# Patient Record
Sex: Female | Born: 1965 | Race: Black or African American | Hispanic: No | Marital: Single
Health system: Midwestern US, Community
[De-identification: ages and names within clinical notes are randomized; demographics above are authoritative.]

## PROBLEM LIST (undated history)

## (undated) DIAGNOSIS — Z1231 Encounter for screening mammogram for malignant neoplasm of breast: Principal | ICD-10-CM

## (undated) DIAGNOSIS — Z1239 Encounter for other screening for malignant neoplasm of breast: Secondary | ICD-10-CM

## (undated) DIAGNOSIS — R922 Inconclusive mammogram: Secondary | ICD-10-CM

## (undated) DIAGNOSIS — Z01419 Encounter for gynecological examination (general) (routine) without abnormal findings: Secondary | ICD-10-CM

## (undated) DIAGNOSIS — R928 Other abnormal and inconclusive findings on diagnostic imaging of breast: Secondary | ICD-10-CM

## (undated) DIAGNOSIS — Z13820 Encounter for screening for osteoporosis: Secondary | ICD-10-CM

## (undated) DIAGNOSIS — Z1211 Encounter for screening for malignant neoplasm of colon: Secondary | ICD-10-CM

## (undated) DIAGNOSIS — F411 Generalized anxiety disorder: Secondary | ICD-10-CM

## (undated) DIAGNOSIS — R519 Headache, unspecified: Secondary | ICD-10-CM

## (undated) DIAGNOSIS — G478 Other sleep disorders: Secondary | ICD-10-CM

## (undated) DIAGNOSIS — J309 Allergic rhinitis, unspecified: Secondary | ICD-10-CM

## (undated) DIAGNOSIS — M1990 Unspecified osteoarthritis, unspecified site: Secondary | ICD-10-CM

## (undated) DIAGNOSIS — M2550 Pain in unspecified joint: Secondary | ICD-10-CM

## (undated) HISTORY — DX: Unspecified osteoarthritis, unspecified site: M19.90

## (undated) HISTORY — DX: Pain in unspecified joint: M25.50

## (undated) HISTORY — DX: Other sleep disorders: G47.8

## (undated) HISTORY — DX: Allergic rhinitis, unspecified: J30.9

## (undated) HISTORY — PX: PR TOTAL ABDOMINAL HYSTERECT W/WO RMVL TUBE OVARY: 58150

## (undated) HISTORY — PX: PR UNLISTED PROCEDURE HANDS/FINGERS: 26989

## (undated) HISTORY — DX: Generalized anxiety disorder: F41.1

## (undated) HISTORY — PX: PR ANES; KNEE ARTHROSCOPY: AN01382

## (undated) HISTORY — DX: Headache, unspecified: R51.9

## (undated) NOTE — Progress Notes (Signed)
Transcription accepted by Kamrin Sibley STEBBINS on 02/22/2014 at  9:45 AM  ------  duplicate

## (undated) NOTE — Progress Notes (Signed)
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 Full extension and 120 degrees of flexion were achieved. She has been in intensive physical therapy. She has made progress.    PHYSICAL EXAMINATION: I can get her to within 5 of extension with over pressure and flexion at 90 to 95 today. In therapy, she has been able to get a little bit more both ways.    RECOMMENDATION: We are going to persist and try a home extension knee arthrosis and re-evaluate in 2 weeks. If there is plateauing of the flexion gain, I think that she should have a second manipulation with no anticipation of admission that time, but just to help her to continue to improve in flexion. We will discuss this with her insurance company.     [name], MD  RSC/js

## (undated) NOTE — Progress Notes (Signed)
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 She has made dramatic progress since manipulation and aggressive physical therapy. She continues to improve. Her mobilization is finally in a positive direction.    PHYSICAL EXAMINATION: Range of motion today was 10 degrees to 105 degrees, and she ambulates much better, but she still has a Trendelenburg limp and I am sure she is very deconditioned from being so disabled for over a year.    X-RAY: Her knee was x-ray today with excellent position of all components.    RECOMMENDATION: She is to continue in physical therapy 2 to 3 times per week for the next 4 weeks and then follow up again.     [name], MD  RSC/js

## (undated) NOTE — Progress Notes (Signed)
Transcription accepted by Elenora Gamma STEBBINS on 03/15/2014 at 11:13 AM  ------    Judith [name], Judith [name] LEA  [id number]    March 08, 2014    HISTORY: A 72 year old white female 4 weeks postop right total knee replacement.    PHYSICAL EXAMINATION: She has 1 sub-q suture coming through her proximal incision that was cut off and the area cleaned with peroxide. That has healed over now. She has the usual amount of swelling expected and range of motion of 5 to 90.     RECOMMENDATION: She had been getting home physical therapy. I think she just started outpatient physical therapy now. She is still on oxycodone at 5 mg to 10 mg at a time, gradually withdrawing and weaning down. She needs to continue this. She is given a renewal to try to get down to 1/2 to 1 tablet. She should follow up in 4 weeks.     [name], MD  RSC/js

## (undated) NOTE — Progress Notes (Signed)
Transcription accepted by Elenora Gamma STEBBINS on 02/22/2014 at  9:45 AM  ------    Judith [name], Judith [name]  [id number]    December 15, 2013    HISTORY: She returns. She had a corticosteroid injection a month ago had no benefit and continues with constant lateral right knee pain, painful limp and inability to function and inability to walk any significant distance or do any prolonged standing. I think she is a candidate for right total knee replacement and will schedule her in the near future.     PHYSICAL EXAMINATION:   Vital Signs: Blood pressure 121/73, pulse 72 and regular, O2 sat 99%.  General Appearance: A middle-aged white female to oriented place, time, person in no acute distress.  HEENT: Pupils equal, round and reactive to light. EOM intact. Throat clear.  Neck: Supple with no masses. Equal carotid pulses bilaterally.  Chest: Equal breath sounds bilaterally.  Heart: Normal sinus rhythm. No murmurs heard.  Abdomen: Nontender. No masses or organomegaly.   Extremity: Exam is unchanged from the detailed description of the visit on November 17, 2013.     IMPRESSION: Osteoarthritis, right knee.    PLAN: The plan will be to proceed with right total knee replacement. We have discussed this procedure in detail including the need for anesthesia and efforts for pain management with extrapleural injection in the knee, possible nerve block. The patient understands there is a risk of infection, deep vein thrombophlebitis which could lead to fatal pulmonary embolus, the potential for inadvertent injury to arteries or nerves and bone or tendon ligament, surgery could require repeat surgeries, compromise in outcome and left knee rehabilitation will be 3 to 6 months in duration. There is potential for stiffness, possible manipulation in the future. She wishes to proceed.     [name], MD  RSC/js

---
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
## 1999-12-09 HISTORY — PX: PR TONSILLECTOMY ONE-HALF <AGE 12: 42825

## 2002-12-08 HISTORY — PX: LAPAROSCOP GASTRIC BYPASS: S2085

## 2013-11-17 ENCOUNTER — Encounter (INDEPENDENT_AMBULATORY_CARE_PROVIDER_SITE_OTHER): Payer: Self-pay | Admitting: Orthopaedic Surgery

## 2013-11-17 ENCOUNTER — Ambulatory Visit (INDEPENDENT_AMBULATORY_CARE_PROVIDER_SITE_OTHER): Admitting: Orthopaedic Surgery

## 2013-11-17 VITALS — BP 133/90 | HR 75 | Ht 62.0 in | Wt 145.0 lb

## 2013-11-18 NOTE — Progress Notes (Signed)
This note was dictated by Clawson, Robert Stebbins.

## 2013-11-20 LAB — R/O STAPH AUREUS C/S

## 2013-11-25 NOTE — Progress Notes (Signed)
This note was dictated by Olden Klauer Stebbins.

## 2013-12-08 HISTORY — PX: JOINT REPLACEMENT: SHX5216

## 2013-12-15 ENCOUNTER — Ambulatory Visit (INDEPENDENT_AMBULATORY_CARE_PROVIDER_SITE_OTHER): Admitting: Orthopaedic Surgery

## 2013-12-15 ENCOUNTER — Other Ambulatory Visit: Payer: Self-pay | Admitting: Orthopaedic Surgery

## 2013-12-15 VITALS — BP 121/73 | HR 72

## 2013-12-15 DIAGNOSIS — M1711 Unilateral primary osteoarthritis, right knee: Secondary | ICD-10-CM

## 2013-12-15 DIAGNOSIS — M1710 Unilateral primary osteoarthritis, unspecified knee: Secondary | ICD-10-CM

## 2013-12-15 DIAGNOSIS — M25561 Pain in right knee: Secondary | ICD-10-CM

## 2013-12-15 DIAGNOSIS — Z01818 Encounter for other preprocedural examination: Secondary | ICD-10-CM

## 2013-12-15 DIAGNOSIS — M25569 Pain in unspecified knee: Secondary | ICD-10-CM

## 2013-12-15 LAB — COMPREHENSIVE METABOLIC PANEL
ALT (GPT): 18 U/L (ref 6–40)
AST (GOT): 25 U/L (ref 15–40)
Albumin: 4.1 g/dL (ref 3.5–5.2)
Alkaline Phosphatase (Total): 80 U/L (ref 34–121)
Anion Gap: 7 (ref 3–11)
Bilirubin (Total): 0.5 mg/dL (ref 0.2–1.3)
Calcium: 9.5 mg/dL (ref 8.9–10.2)
Carbon Dioxide, Total: 29 mEq/L (ref 22–32)
Chloride: 105 mEq/L (ref 98–108)
Creatinine: 0.66 mg/dL (ref 0.38–1.02)
GFR, Calc, African American: >60 mL/min (ref >59)
GFR, Calc, European American: >60 mL/min (ref >59)
Glucose: 107 mg/dL (ref 62–125)
Potassium: 5.2 mEq/L (ref 3.7–5.2)
Protein (Total): 6.6 g/dL (ref 6.0–8.2)
Sodium: 141 mEq/L (ref 136–145)
Urea Nitrogen: 13 mg/dL (ref 8–21)

## 2013-12-15 LAB — CBC, DIFF
% Basophils: 0 %
% Eosinophils: 1 %
% Immature Granulocytes: 0 %
% Lymphocytes: 47 %
% Monocytes: 8 %
% Neutrophils: 44 %
Absolute Eosinophil Count: 0.06 10*3/uL (ref 0.00–0.50)
Absolute Lymphocyte Count: 2.71 10*3/uL (ref 1.00–4.80)
Basophils: 0.01 10*3/uL (ref 0.00–0.20)
Hematocrit: 41 % (ref 36–45)
Hemoglobin: 13.5 g/dL (ref 11.5–15.5)
Immature Granulocytes: 0 10*3/uL (ref 0.00–0.05)
MCH: 28.9 pg (ref 27.3–33.6)
MCHC: 32.8 g/dL (ref 32.2–36.5)
MCV: 88 fL (ref 81–98)
Monocytes: 0.48 10*3/uL (ref 0.00–0.80)
Neutrophils: 2.56 10*3/uL (ref 1.80–7.00)
Platelet Count: 359 10*3/uL (ref 150–400)
RBC: 4.67 mil/uL (ref 3.80–5.00)
RDW-CV: 14.2 % (ref 11.6–14.4)
WBC: 5.82 10*3/uL (ref 4.3–10.0)

## 2013-12-15 NOTE — Progress Notes (Signed)
Preoperative instruction given per [facility] protocol. Literature for DPOA, [employer] Directive, Pt rights and Responsibilities, and [facility] About Your Surgery booklet given. Stressed NPO status after midnight, ruling out sips of water, ice, etc prior to surgery. Explained what medications to avoid and use of chorhexidine soap prior to surgery. Stressed the importance of having someone with the patient 24hrs a day for 7 days post operatively. Signs and symptoms of post-op complications including infection, DVTs, and constipation described. The importance of pain control and participation in physical therapy, refill policy on narcotics and antibiotic prophylactics for life also explained. No further questions or concerns at this time, patient advised to call if needed.    Family, mom - specifically, to help patient at home post-operatively. [facility]. [facility] to [facility].    Insurance  Advertising copywriterUNITED HEALTHCARE [employer]ANDARD    My-Linh Barnie AldermanHuynh, BSN, RN  [facility]  [phone]

## 2013-12-21 NOTE — Addendum Note (Signed)
Addended by: Cooper RenderLAKE, NICHOLAS on: 12/21/2013 02:00 PM     Modules accepted: Orders

## 2013-12-21 NOTE — Addendum Note (Signed)
Addended by: LAKE, NICHOLAS on: 12/21/2013 02:00 PM     Modules accepted: Orders

## 2013-12-24 NOTE — Progress Notes (Signed)
This note was dictated by Mikel Pyon Stebbins.

## 2013-12-27 ENCOUNTER — Other Ambulatory Visit: Payer: Self-pay | Admitting: Orthopaedic Surgery

## 2013-12-27 ENCOUNTER — Ambulatory Visit (INDEPENDENT_AMBULATORY_CARE_PROVIDER_SITE_OTHER): Admitting: Orthopaedic Surgery

## 2013-12-27 ENCOUNTER — Encounter (INDEPENDENT_AMBULATORY_CARE_PROVIDER_SITE_OTHER): Payer: Self-pay | Admitting: Orthopaedic Surgery

## 2013-12-27 VITALS — BP 135/73 | HR 96

## 2013-12-27 DIAGNOSIS — Z01818 Encounter for other preprocedural examination: Secondary | ICD-10-CM

## 2013-12-27 DIAGNOSIS — M1711 Unilateral primary osteoarthritis, right knee: Secondary | ICD-10-CM

## 2013-12-27 DIAGNOSIS — M1710 Unilateral primary osteoarthritis, unspecified knee: Secondary | ICD-10-CM

## 2013-12-27 DIAGNOSIS — S025XXA Fracture of tooth (traumatic), initial encounter for closed fracture: Secondary | ICD-10-CM

## 2013-12-27 LAB — URINALYSIS COMPLETE, URN
Bacteria, URN: NONE SEEN
Bilirubin (Qual), URN: NEGATIVE
Epith Cells_Renal/Trans,URN: NEGATIVE /HPF
Glucose Qual, URN: NEGATIVE mg/dL
Ketones, URN: NEGATIVE mg/dL
Leukocyte Esterase, URN: NEGATIVE
Nitrite, URN: NEGATIVE
Occult Blood, URN: NEGATIVE
Protein (Alb Semiquant), URN: NEGATIVE mg/dL
RBC, URN: NEGATIVE /HPF
Specific Gravity, URN: 1.013 g/mL (ref 1.002–1.027)
WBC, URN: NEGATIVE /HPF

## 2013-12-29 LAB — URINE C/S: Colony Count: 1500

## 2013-12-30 DIAGNOSIS — S025XXA Fracture of tooth (traumatic), initial encounter for closed fracture: Secondary | ICD-10-CM | POA: Insufficient documentation

## 2013-12-30 NOTE — Progress Notes (Signed)
HISTORY: This 48 year old white female, who lives in [place], is referred for evaluation of persistent right knee pain. She has a 3 year history of right knee pain, but had arthroscopic partial medial meniscectomy 20 years prior to that which had been quite beneficial at the time. Three years ago, she slipped on some ice and twisted her knee badly with acute pain and swelling. She failed to improve and came in to orthopedic care and within 6 months had arthroscopic surgery with trimming of a new meniscus tear on the medial side. She had a little bit of benefit for a while, but continued with dull constant aching medial knee pain, and occasionally sharper pain, even without activity and a grinding sensation in her knee along the medial joint line. She had another scope about a year ago for a lateral meniscus tear. Removal of it was of no benefit. She has continued to struggle with ongoing pain, difficulty with stairs and squatting; particularly going down hill on stairs. Her walking distance is down to a block. She used to love riding a motorcycle, but can not tolerate the vibration now. She has noted that a heating pad is of some benefit to her. 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 She lives with her mother and has a hobby of making children's dolls.   Lidoderm patches have been beneficial over the medial aspect of her knee for pain and she uses acetaminophen. She is allergic to aspirin and ibuprofen. She has had insomnia. She lost her father 6 months ago and is still grieving and is on Xanax at bedtime for that problem.  This patient had a gastric bypass graft in 2004; she went to 252 to 140. She apparently had a greater weight loss for a while and was concerned that she was getting malnourished, but that has improved over time and she started to stabilize and her current weight, she says, is around 140. She has had become significantly impaired with the ongoing pain. 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  PHYSICAL EXAMINATION: General appearance is a middle-aged, white female oriented to place, time and person and in no acute distress. On standing she has physiologic valgus. She may have just a slight increase in valgus in the right knee. She walks with a little bit of a stiff-knee gait on the right side. She has limited ability to do a deep knee bend, about 30 degrees. Her range of motion of right knee is 0 to 95, bit is 0 to 130 on the left. At the right knee she has a mild effusion, 2+ medial and lateral joint line tenderness and mild patellofemoral crepitus. Ligamentous evaluation, varus and valgus stress and anterior and posterior drawer sign are negative. The McMurray's maneuver was painful. Supine she appeared to have equal leg lengths and full range of motion of both hips.    PHYSICAL EXAMINATION:   Vital Signs: Blood pressure 121/73, pulse 72 and regular, O2 sat 99%.  General Appearance: A middle-aged white female to oriented place, time, person in no acute distress.  HEENT: Pupils equal, round and reactive to light. EOM intact. Throat clear.  Neck: Supple with no masses. Equal carotid pulses bilaterally.  Chest: Equal breath sounds bilaterally.  Heart: Normal sinus rhythm. No murmurs heard.  Abdomen: Nontender. No masses or organomegaly.   Extremity:  Exam is unchanged from the detailed description of the visit on November 17, 2013.      X-RAYS: Four view standing bilateral knee series shows marked lateral joint space narrowing on the standing, PA flexed knee view. The left knee has a small medial joint line osteophytes.  She also had an MRI in November which shows severe cartilage loss on the lateral patellar facet and severe cartilage thinning laterally with narrowing of the joint space significantly and  moderate cartilage changes medially. 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    This note was dictated by Chauncey Fischerlawson, Phelan Schadt Stebbins.

## 2014-01-20 ENCOUNTER — Encounter (INDEPENDENT_AMBULATORY_CARE_PROVIDER_SITE_OTHER): Admitting: Physician Assistant

## 2014-01-25 ENCOUNTER — Other Ambulatory Visit: Payer: Self-pay | Admitting: Orthopaedic Surgery

## 2014-01-25 LAB — COMPREHENSIVE METABOLIC PANEL
ALT (GPT): 12 U/L (ref 6–40)
AST (GOT): 21 U/L (ref 15–40)
Albumin: 3.8 g/dL (ref 3.5–5.2)
Alkaline Phosphatase (Total): 75 U/L (ref 34–121)
Anion Gap: 6 (ref 3–11)
Bilirubin (Total): 0.5 mg/dL (ref 0.2–1.3)
Calcium: 9.5 mg/dL (ref 8.9–10.2)
Carbon Dioxide, Total: 30 mEq/L (ref 22–32)
Chloride: 104 mEq/L (ref 98–108)
Creatinine: 0.63 mg/dL (ref 0.38–1.02)
GFR, Calc, African American: >60 mL/min (ref >59)
GFR, Calc, European American: >60 mL/min (ref >59)
Glucose: 97 mg/dL (ref 62–125)
Potassium: 4.2 mEq/L (ref 3.7–5.2)
Protein (Total): 6.3 g/dL (ref 6.0–8.2)
Sodium: 140 mEq/L (ref 136–145)
Urea Nitrogen: 10 mg/dL (ref 8–21)

## 2014-01-25 LAB — CBC, DIFF
% Basophils: 0 %
% Eosinophils: 1 %
% Immature Granulocytes: 0 %
% Lymphocytes: 40 %
% Monocytes: 7 %
% Neutrophils: 52 %
Absolute Eosinophil Count: 0.07 10*3/uL (ref 0.00–0.50)
Absolute Lymphocyte Count: 2.14 10*3/uL (ref 1.00–4.80)
Basophils: 0.02 10*3/uL (ref 0.00–0.20)
Hematocrit: 40 % (ref 36–45)
Hemoglobin: 13.1 g/dL (ref 11.5–15.5)
Immature Granulocytes: 0.01 10*3/uL (ref 0.00–0.05)
MCH: 28.5 pg (ref 27.3–33.6)
MCHC: 32.4 g/dL (ref 32.2–36.5)
MCV: 88 fL (ref 81–98)
Monocytes: 0.36 10*3/uL (ref 0.00–0.80)
Neutrophils: 2.78 10*3/uL (ref 1.80–7.00)
Platelet Count: 288 10*3/uL (ref 150–400)
RBC: 4.6 mil/uL (ref 3.80–5.00)
RDW-CV: 13.9 % (ref 11.6–14.4)
WBC: 5.38 10*3/uL (ref 4.3–10.0)

## 2014-01-25 LAB — URINALYSIS COMPLETE, URN
Bacteria, URN: NONE SEEN
Bilirubin (Qual), URN: NEGATIVE
Epith Cells_Renal/Trans,URN: NEGATIVE /HPF
Glucose Qual, URN: NEGATIVE mg/dL
Ketones, URN: NEGATIVE mg/dL
Leukocyte Esterase, URN: NEGATIVE
Nitrite, URN: NEGATIVE
Occult Blood, URN: NEGATIVE
Protein (Alb Semiquant), URN: NEGATIVE mg/dL
RBC, URN: NEGATIVE /HPF
Specific Gravity, URN: 1.003 g/mL (ref 1.002–1.027)
WBC, URN: NEGATIVE /HPF
pH, URN: 7 (ref 5.0–8.0)

## 2014-01-28 LAB — URINE C/S: Colony Count: 20000

## 2014-01-28 LAB — R/O STAPH AUREUS C/S

## 2014-02-06 DIAGNOSIS — IMO0002 Reserved for concepts with insufficient information to code with codable children: Secondary | ICD-10-CM

## 2014-02-06 HISTORY — PX: PR ANES; TOTAL KNEE REPLACEMENT: AN27447

## 2014-02-10 ENCOUNTER — Telehealth (INDEPENDENT_AMBULATORY_CARE_PROVIDER_SITE_OTHER): Payer: Self-pay | Admitting: Orthopaedic Surgery

## 2014-02-10 NOTE — Telephone Encounter (Signed)
Returned call to DeForestJodi to give verbal [facility] 3x2, 2x1.

## 2014-02-10 NOTE — Telephone Encounter (Signed)
Need verbal orders 1 week 1,3x a week for 2 weeks and, 2x for 1.

## 2014-02-15 ENCOUNTER — Encounter (INDEPENDENT_AMBULATORY_CARE_PROVIDER_SITE_OTHER): Admitting: Orthopaedic Surgery

## 2014-02-22 ENCOUNTER — Encounter (INDEPENDENT_AMBULATORY_CARE_PROVIDER_SITE_OTHER): Admitting: Orthopaedic Surgery

## 2014-02-22 ENCOUNTER — Encounter (INDEPENDENT_AMBULATORY_CARE_PROVIDER_SITE_OTHER): Payer: Self-pay | Admitting: Orthopaedic Surgery

## 2014-02-22 ENCOUNTER — Ambulatory Visit (INDEPENDENT_AMBULATORY_CARE_PROVIDER_SITE_OTHER): Admitting: Orthopaedic Surgery

## 2014-02-22 VITALS — BP 130/62 | HR 101

## 2014-02-22 DIAGNOSIS — Z471 Aftercare following joint replacement surgery: Secondary | ICD-10-CM | POA: Insufficient documentation

## 2014-02-22 MED ORDER — OXYCODONE HCL 5 MG OR TABS
5.0000 mg | ORAL_TABLET | ORAL | Status: DC | PRN
Start: 2014-02-22 — End: 2014-03-08

## 2014-02-22 NOTE — Progress Notes (Signed)
This note was dictated by Cruise Baumgardner Stebbins.

## 2014-03-08 ENCOUNTER — Ambulatory Visit (INDEPENDENT_AMBULATORY_CARE_PROVIDER_SITE_OTHER): Admitting: Orthopaedic Surgery

## 2014-03-08 ENCOUNTER — Encounter (INDEPENDENT_AMBULATORY_CARE_PROVIDER_SITE_OTHER): Payer: Self-pay | Admitting: Orthopaedic Surgery

## 2014-03-08 VITALS — BP 126/80 | HR 67

## 2014-03-08 DIAGNOSIS — Z471 Aftercare following joint replacement surgery: Secondary | ICD-10-CM

## 2014-03-08 MED ORDER — OXYCODONE HCL 5 MG OR TABS
ORAL_TABLET | ORAL | Status: AC
Start: 2014-03-08 — End: ?

## 2014-03-08 NOTE — Progress Notes (Signed)
This note was dictated by Adhira Jamil Stebbins.

## 2014-03-22 ENCOUNTER — Encounter: Payer: Self-pay | Admitting: Orthopaedic Surgery

## 2014-03-22 ENCOUNTER — Ambulatory Visit (INDEPENDENT_AMBULATORY_CARE_PROVIDER_SITE_OTHER): Admitting: Orthopaedic Surgery

## 2014-03-22 VITALS — BP 108/73 | HR 98

## 2014-03-22 DIAGNOSIS — Z471 Aftercare following joint replacement surgery: Secondary | ICD-10-CM

## 2014-03-22 NOTE — Progress Notes (Signed)
This note was dictated by Nimco Bivens Stebbins.

## 2014-03-23 NOTE — Addendum Note (Signed)
Addended by: Alva GarnetWALSH, SHEILA on: 03/23/2014 09:54 AM     Modules accepted: Orders

## 2014-03-27 DIAGNOSIS — Z96659 Presence of unspecified artificial knee joint: Secondary | ICD-10-CM

## 2014-03-27 DIAGNOSIS — M2469 Ankylosis, other specified joint: Secondary | ICD-10-CM

## 2014-03-27 DIAGNOSIS — M24669 Ankylosis, unspecified knee: Secondary | ICD-10-CM

## 2014-03-27 HISTORY — PX: SURGICAL HX OTHER: 99

## 2014-03-29 ENCOUNTER — Telehealth (INDEPENDENT_AMBULATORY_CARE_PROVIDER_SITE_OTHER): Payer: Self-pay | Admitting: Orthopaedic Surgery

## 2014-03-29 NOTE — Telephone Encounter (Signed)
Pt has her first PT appt today after her manipulation. They want to talk to someone about a CPM machine for this patient. Ask for Jacolyn ReedyJohn Miller as he is the therapist working with her.

## 2014-03-30 NOTE — Telephone Encounter (Signed)
Judith ReedyJohn [name] called and was very frustrated that nobody called him back yesterday, he is dealing with a difficult patient.  He would like a call back as soon as possible as he called 2 times yesterday and he would like an answer.

## 2014-03-30 NOTE — Telephone Encounter (Signed)
Verbal ok given for CPM machine. Patient was instructed to come to office 03/31/14.

## 2014-03-31 ENCOUNTER — Encounter (INDEPENDENT_AMBULATORY_CARE_PROVIDER_SITE_OTHER): Admitting: Orthopaedic Surgery

## 2014-03-31 ENCOUNTER — Ambulatory Visit (INDEPENDENT_AMBULATORY_CARE_PROVIDER_SITE_OTHER): Admitting: Orthopaedic Surgery

## 2014-03-31 VITALS — BP 120/72 | HR 98 | Ht 62.0 in | Wt 135.0 lb

## 2014-03-31 DIAGNOSIS — Z471 Aftercare following joint replacement surgery: Secondary | ICD-10-CM

## 2014-03-31 DIAGNOSIS — M24669 Ankylosis, unspecified knee: Secondary | ICD-10-CM

## 2014-03-31 DIAGNOSIS — M2469 Ankylosis, other specified joint: Secondary | ICD-10-CM | POA: Insufficient documentation

## 2014-03-31 NOTE — Progress Notes (Signed)
This note was dictated by Charlina Dwight Stebbins.

## 2014-04-05 ENCOUNTER — Encounter (INDEPENDENT_AMBULATORY_CARE_PROVIDER_SITE_OTHER): Admitting: Orthopaedic Surgery

## 2014-04-07 ENCOUNTER — Encounter (INDEPENDENT_AMBULATORY_CARE_PROVIDER_SITE_OTHER): Admitting: Orthopaedic Surgery

## 2014-04-13 ENCOUNTER — Ambulatory Visit (INDEPENDENT_AMBULATORY_CARE_PROVIDER_SITE_OTHER): Admitting: Orthopaedic Surgery

## 2014-04-13 ENCOUNTER — Encounter (INDEPENDENT_AMBULATORY_CARE_PROVIDER_SITE_OTHER): Payer: Self-pay | Admitting: Orthopaedic Surgery

## 2014-04-13 VITALS — BP 118/75 | HR 81

## 2014-04-13 DIAGNOSIS — Z471 Aftercare following joint replacement surgery: Secondary | ICD-10-CM

## 2014-04-13 DIAGNOSIS — M2469 Ankylosis, other specified joint: Secondary | ICD-10-CM

## 2014-04-13 DIAGNOSIS — M24669 Ankylosis, unspecified knee: Secondary | ICD-10-CM

## 2014-04-13 NOTE — Progress Notes (Signed)
This note was dictated by Genecis Veley Stebbins.

## 2014-04-20 ENCOUNTER — Telehealth (INDEPENDENT_AMBULATORY_CARE_PROVIDER_SITE_OTHER): Payer: Self-pay | Admitting: Orthopaedic Surgery

## 2014-04-20 NOTE — Telephone Encounter (Signed)
Pt calling for clarification. She thought the manipulation was going to be at a later date but received the phone call from anesthesia about it being scheduled tomorrow. Please call her ASAP. [phone]

## 2014-04-20 NOTE — Telephone Encounter (Signed)
Surgery has been cancelled. Patient notified. She states that she is making progress in therapy.

## 2014-04-20 NOTE — Telephone Encounter (Signed)
John, physical therapist, called to get clarification on Ms. Krajewski's manipulation. The last time he talked to Dr. Michele Mcalpinelawson they were going to see how Tad MooreCasandra does as far as improvement and then see her back on May 21st. The patient got a call yesterday that her manipulation is now scheduled for tomorrow. Please call John to discuss! They feel like she is making improvement.

## 2014-04-27 ENCOUNTER — Encounter (INDEPENDENT_AMBULATORY_CARE_PROVIDER_SITE_OTHER): Admitting: Orthopaedic Surgery

## 2014-05-05 ENCOUNTER — Ambulatory Visit (INDEPENDENT_AMBULATORY_CARE_PROVIDER_SITE_OTHER): Admitting: Orthopaedic Surgery

## 2014-05-05 ENCOUNTER — Encounter (INDEPENDENT_AMBULATORY_CARE_PROVIDER_SITE_OTHER): Admitting: Orthopaedic Surgery

## 2014-05-05 VITALS — BP 130/90 | HR 83 | Ht 62.0 in | Wt 135.0 lb

## 2014-05-05 DIAGNOSIS — M24669 Ankylosis, unspecified knee: Secondary | ICD-10-CM

## 2014-05-05 DIAGNOSIS — Z471 Aftercare following joint replacement surgery: Secondary | ICD-10-CM

## 2014-05-05 DIAGNOSIS — Z96659 Presence of unspecified artificial knee joint: Secondary | ICD-10-CM

## 2014-05-05 DIAGNOSIS — M2469 Ankylosis, other specified joint: Secondary | ICD-10-CM

## 2014-05-05 NOTE — Progress Notes (Signed)
This note was dictated by Tamaria Dunleavy Stebbins.

## 2014-06-01 ENCOUNTER — Ambulatory Visit (INDEPENDENT_AMBULATORY_CARE_PROVIDER_SITE_OTHER): Admitting: Orthopaedic Surgery

## 2014-06-01 VITALS — BP 107/80 | HR 82

## 2014-06-01 DIAGNOSIS — M24669 Ankylosis, unspecified knee: Secondary | ICD-10-CM

## 2014-06-01 DIAGNOSIS — M2469 Ankylosis, other specified joint: Secondary | ICD-10-CM

## 2014-06-01 DIAGNOSIS — Z471 Aftercare following joint replacement surgery: Secondary | ICD-10-CM

## 2014-06-01 NOTE — Progress Notes (Signed)
This note was dictated by Timarion Agcaoili Stebbins.

## 2014-07-06 ENCOUNTER — Ambulatory Visit (INDEPENDENT_AMBULATORY_CARE_PROVIDER_SITE_OTHER): Admitting: Orthopaedic Surgery

## 2014-07-06 ENCOUNTER — Encounter (INDEPENDENT_AMBULATORY_CARE_PROVIDER_SITE_OTHER): Payer: Self-pay | Admitting: Orthopaedic Surgery

## 2014-07-06 VITALS — BP 93/41 | HR 80

## 2014-07-06 DIAGNOSIS — Z96651 Presence of right artificial knee joint: Secondary | ICD-10-CM

## 2014-07-06 DIAGNOSIS — Z471 Aftercare following joint replacement surgery: Secondary | ICD-10-CM

## 2014-07-06 DIAGNOSIS — Z96659 Presence of unspecified artificial knee joint: Secondary | ICD-10-CM

## 2014-07-06 NOTE — Progress Notes (Signed)
This note was dictated by Sole Lengacher Stebbins.

## 2014-10-12 ENCOUNTER — Ambulatory Visit (HOSPITAL_BASED_OUTPATIENT_CLINIC_OR_DEPARTMENT_OTHER): Payer: Self-pay | Admitting: Counselor

## 2014-10-19 ENCOUNTER — Encounter (HOSPITAL_BASED_OUTPATIENT_CLINIC_OR_DEPARTMENT_OTHER): Payer: Self-pay | Admitting: Counselor

## 2014-10-26 ENCOUNTER — Encounter (HOSPITAL_BASED_OUTPATIENT_CLINIC_OR_DEPARTMENT_OTHER): Payer: Self-pay | Admitting: Counselor

## 2014-11-09 ENCOUNTER — Encounter (HOSPITAL_BASED_OUTPATIENT_CLINIC_OR_DEPARTMENT_OTHER): Payer: Self-pay | Admitting: Counselor

## 2015-12-04 ENCOUNTER — Encounter

## 2015-12-04 ENCOUNTER — Inpatient Hospital Stay: Admit: 2015-12-04 | Payer: PRIVATE HEALTH INSURANCE

## 2015-12-04 DIAGNOSIS — Z1231 Encounter for screening mammogram for malignant neoplasm of breast: Secondary | ICD-10-CM

## 2016-01-14 ENCOUNTER — Telehealth (INDEPENDENT_AMBULATORY_CARE_PROVIDER_SITE_OTHER): Payer: Self-pay | Admitting: Orthopaedic Surgery

## 2016-01-14 NOTE — Telephone Encounter (Signed)
(  TEXTING IS AN OPTION FOR [facility] ONLY)  Is this a [facility] clinic? No      RETURN CALL: Detailed message on voicemail only      SUBJECT:  Appointment Request     REASON FOR REQUEST/SYMPTOMS: ER follow-up possible re-injury to right knee   REFERRING PROVIDER: ER  REQUEST APPOINTMENT WITH: Dr. Michele Mcalpine  REQUESTED DATE: sometime next week, TIME: mornings  UNABLE TO APPOINT BECAUSE: per sop

## 2016-01-14 NOTE — Telephone Encounter (Signed)
RETURN CALL: General message OK      SUBJECT:  General Message     REASON FOR REQUEST: Patient calling regarding urgent symptom (patient of Dr.Clawson)    MESSAGE: Patient has knee pain from a recent fall, had complete knee replacement with Dr.Clawson. Please advise.

## 2016-01-14 NOTE — Telephone Encounter (Signed)
Please refer to both closed TEs on 01/14/2016. Patient returning phone call, patient is okay with waiting til CLAWSON, ROBERT STEBBINS is back in the office. Please call the patient and discuss, thank you.     RETURN CALL: Detailedmessage OK      SUBJECT: Appointment Request     REASON FOR REQUEST/SYMPTOMS: Patient seen at [facility] hospital emergency department after fall, knee pain  REFERRING PROVIDER: established  REQUEST APPOINTMENT WITH: Dr.Clawson  REQUESTED DATE: as soon as possible, TIME: anytime  UNABLE TO APPOINT BECAUSE: [facility] unable to appoint per SOP

## 2016-01-14 NOTE — Telephone Encounter (Signed)
LVM for patient to schedule ER f/u appointment.

## 2016-01-14 NOTE — Telephone Encounter (Signed)
Called patient, states she already received a call to schedule follow up from ER visit. Patient scheduled to see Dr. Michele Mcalpine on Thursday.

## 2016-01-14 NOTE — Telephone Encounter (Signed)
Left message letting patient know to call and set up an appointment with Dr. Michele Mcalpine, he is currently out of the office but she can also set up an appointment with her PCP if she needs to get in sooner.

## 2016-01-14 NOTE — Telephone Encounter (Signed)
RETURN CALL: General message OK      SUBJECT:  Appointment Request     REASON FOR REQUEST/SYMPTOMS: Patient seen at [facility] hospital emergency department after fall, knee pain  REFERRING PROVIDER: established  REQUEST APPOINTMENT WITH: Dr.Clawson  REQUESTED DATE: as soon as possible, TIME:   UNABLE TO APPOINT BECAUSE: [facility] cannot appointment ED follow up

## 2016-01-14 NOTE — Telephone Encounter (Signed)
Patient is s/p R TKA on 02/06/14, MUA 03/27/14. Attempted to return call to patient, but no answer. Left a voicemail message to call the clinic back at earliest convenience.

## 2016-01-16 ENCOUNTER — Telehealth (INDEPENDENT_AMBULATORY_CARE_PROVIDER_SITE_OTHER): Payer: Self-pay | Admitting: Orthopaedic Surgery

## 2016-01-16 NOTE — Telephone Encounter (Signed)
Rerouting.    Please advise.

## 2016-01-16 NOTE — Telephone Encounter (Signed)
(  TEXTING IS AN OPTION FOR [facility] ONLY)  Is this a [facility] clinic? No      RETURN CALL: OK to leave detailed message with anyone that answers      SUBJECT:  General Message     REASON FOR REQUEST: patient returning call from [place].  Please request xray from [facility] in [facility].  Their fax number is [phone]  Thank you    MESSAGE: see above

## 2016-01-17 ENCOUNTER — Ambulatory Visit (INDEPENDENT_AMBULATORY_CARE_PROVIDER_SITE_OTHER): Admitting: Orthopaedic Surgery

## 2016-01-17 VITALS — BP 117/77 | HR 80

## 2016-01-17 DIAGNOSIS — Z96651 Presence of right artificial knee joint: Secondary | ICD-10-CM

## 2016-01-17 DIAGNOSIS — S86811A Strain of other muscle(s) and tendon(s) at lower leg level, right leg, initial encounter: Secondary | ICD-10-CM | POA: Insufficient documentation

## 2016-01-17 NOTE — Telephone Encounter (Signed)
Patient's films are now available on [employer]

## 2016-01-21 NOTE — Progress Notes (Signed)
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  She had a right total knee with a DePuy ATTUNE knee prosthesis.  She struggled with range of motion initially, but, when last seen 3 months' postop, had range of motion of 0-120 and was improving.  She tells me that she went ahead and developed essentially full extension and 120 degrees of flexion.  She has been doing well.  She has been relieved of the preop pain, had gone back to work.    With the recent snow, she walked outside a week ago.  With snow on her foot, she came inside and slipped on a floor and fell, causing a severe flexion force on her right total knee, so that it flexed more than it had ever done.  Since then, she has had marked pain.  She went to an emergency care place, and an x-ray suggested no injury to the prosthesis.  She has been in a knee immobilizer.  She also injured her right ankle on the lateral side, is wearing an air boot for a lateral ankle sprain.  She is improving.  She can walk without the devices with a walker.    PHYSICAL EXAMINATION     Her knee is noted to be swollen.  There is ecchymosis over the anterior medial and lateral capsule of the knee, and she is tender in this area and tender over her patellar tendon.  She tolerates 90 degrees of flexion, past that it becomes painful.  She has extension power.  It is painful enough that it is weakened with resistance.    IMAGING     X-rays were reviewed.  There does not appear to be any disruption of the patella or alteration in patellar height or distance.    IMPRESSION     A patellar tendon strain, status post right total knee arthroplasty.    RECOMMENDATION     I think she should protect this knee to avoid reinjury for about 6 weeks.  She will have a home visit from [facility] once they get a lightweight lockable knee brace in  about a week.  In the interim, she will have to continue to use the knee immobilizer.  It makes it impossible for her to drive, but I think that the degree of pain that she is in now implies at least some injury that needs to recover, and she is at risk for further injury if she were to fall again.  She will follow up with [place] in 6 weeks or sooner for any issues or concerns, and we will be in touch once she gets the new brace.

## 2016-01-24 ENCOUNTER — Telehealth (INDEPENDENT_AMBULATORY_CARE_PROVIDER_SITE_OTHER): Payer: Self-pay | Admitting: Orthopaedic Surgery

## 2016-01-24 NOTE — Telephone Encounter (Signed)
Patient states that they have not received a call back regarding their brace for their knee. She states that at their last appointment, they had concluded that her current one is too small. Please return call to patient to discuss.

## 2016-01-24 NOTE — Telephone Encounter (Signed)
Called and told her that I have spoken to [facility] about this.

## 2016-01-29 NOTE — Telephone Encounter (Signed)
Talked to [place] today and she has the new brace.

## 2016-01-30 ENCOUNTER — Telehealth (INDEPENDENT_AMBULATORY_CARE_PROVIDER_SITE_OTHER): Payer: Self-pay | Admitting: Orthopaedic Surgery

## 2016-01-30 NOTE — Telephone Encounter (Signed)
Re-routed to clinical pool

## 2016-01-30 NOTE — Telephone Encounter (Signed)
(  TEXTING IS AN OPTION FOR [facility] ONLY)  Is this a [facility] clinic? No      RETURN CALL: Detailed message on voicemail only      SUBJECT:  Form/Letter/Paperwork Request     REASON FOR REQUEST: Patient called to state that she is returning a call to [facility] regarding her return to work paperwork. Please call patient back to advise.    Thank you!    FORM/LETTER/PAPERWORK IS FOR: return to work  [employer] SHOULD FILL IT OUT?: [name], Judith Patty  WILL PATIENT PICK UP?: PLEASE COORDINATE WITH PATIENT   NEEDED BY: 01/30/16  ADDITIONAL INFORMATION: na

## 2016-01-31 NOTE — Telephone Encounter (Signed)
I called patient and have re faxed the papers to her and her employer.

## 2016-01-31 NOTE — Telephone Encounter (Signed)
Re-routed to clinical pool

## 2016-01-31 NOTE — Telephone Encounter (Signed)
Patient calling back, she and employer did not receive the faxes. She would like to have it tried 1 more time if possible. She would also like a copy of the paperwork - mailed to home address on record.

## 2016-01-31 NOTE — Telephone Encounter (Signed)
Called [place] and told her we have tried multiple times, with and without the 1 prrefix and none have gone through. I told her we could send them in the mail tomorrow.

## 2016-02-01 NOTE — Telephone Encounter (Signed)
Patient called back and stated okay to mail to her address.

## 2016-02-12 NOTE — Telephone Encounter (Signed)
Letter has been mailed to patient's address on file.

## 2016-03-03 ENCOUNTER — Telehealth (INDEPENDENT_AMBULATORY_CARE_PROVIDER_SITE_OTHER): Payer: Self-pay | Admitting: Orthopaedic Surgery

## 2016-03-03 NOTE — Telephone Encounter (Signed)
DOS: 02/07/14. Patient has a dental procedure 03/04/16 to have 2 teeth pulled. She would like to know if she needs to take antibiotics for this procedure. Please return call before 4:30pm today before the dental office closes if possible.

## 2016-03-03 NOTE — Telephone Encounter (Signed)
Yes, she needs to take antibiotics prior to dental work for the remainder of her life. Called and let her know this.

## 2016-11-21 ENCOUNTER — Ambulatory Visit: Payer: PRIVATE HEALTH INSURANCE

## 2016-11-21 ENCOUNTER — Inpatient Hospital Stay

## 2016-12-03 ENCOUNTER — Encounter

## 2016-12-03 ENCOUNTER — Inpatient Hospital Stay: Admit: 2016-12-03 | Payer: BLUE CROSS/BLUE SHIELD

## 2016-12-03 DIAGNOSIS — Z1231 Encounter for screening mammogram for malignant neoplasm of breast: Secondary | ICD-10-CM

## 2017-04-09 ENCOUNTER — Encounter (INDEPENDENT_AMBULATORY_CARE_PROVIDER_SITE_OTHER): Payer: Self-pay | Admitting: Orthopaedic Surgery

## 2017-04-09 ENCOUNTER — Ambulatory Visit (INDEPENDENT_AMBULATORY_CARE_PROVIDER_SITE_OTHER): Admitting: Orthopaedic Surgery

## 2017-04-09 VITALS — BP 130/81 | HR 82 | Ht 62.0 in | Wt 167.8 lb

## 2017-04-09 DIAGNOSIS — M1712 Unilateral primary osteoarthritis, left knee: Secondary | ICD-10-CM | POA: Insufficient documentation

## 2017-04-09 DIAGNOSIS — Z6830 Body mass index (BMI) 30.0-30.9, adult: Secondary | ICD-10-CM

## 2017-04-09 DIAGNOSIS — M2342 Loose body in knee, left knee: Secondary | ICD-10-CM | POA: Insufficient documentation

## 2017-04-09 DIAGNOSIS — S83012A Lateral subluxation of left patella, initial encounter: Secondary | ICD-10-CM

## 2017-04-09 NOTE — Progress Notes (Signed)
HISTORY OF PRESENT ILLNESS:  This is a 51 year old white female patient of mine who had a right total knee arthroplasty for post-traumatic arthritis 3 years ago, which is doing well and satisfactory.  She was on the floor playing with a grandchild 4 weeks ago and when she got up she had her left leg in a position where she pivoted inward on it and felt and heard an audible snap with some pain.  Then the knee stiffened up, became rather significantly swollen and she could not get around well and ultimately she was seen in [place] where there was an orthopedic clinic and had x-rays, MRI and evaluation.      She was told that she would get better with time and has not improved, is still limping with a stiff knee gait with a swollen knee and impaired function and ability to do stairs or to squat with significant loss of function from the pre-injury.  There is no ligamentous instability.  She had significant pain with patellofemoral manipulation, lateral displacement of the patella and palpation of the patellar facet is painful.  Medial displacement of the patella is sharply painful.  McMurray's test was uncomfortable.    X-RAYS:  She had a disk with x-rays and an MRI, can only get the AP x-ray, it appeared normal.  On the MRI there is a large effusion.  There is patellofemoral cartilage change with a full thickness cartilage defect of the lateral trochlear groove.  It measured 0.8 x 0.6 cm with central cartilage thinning and a loose body in the medial aspect of the knee within the effusion fluid with moderate narrowing of the lateral femoral condyle cartilage and mild lateral tibial plateau cartilage thinning plus moderate to severe medial tibial plateau cartilage change with subchondral bone marrow edema.  Patella is subluxed laterally on the patellofemoral view.    IMPRESSION:    Early arthritis, left knee, tricompartmental with recent patellar subluxation and lateral trochlear cartilage injury and subsequent  loose body.      RECOMMENDATION:  I think this patient would benefit from arthroscopic evaluation and treatment of this knee injury, removal of the loose body, further assessment of the cartilage damage, potential for drilling or lateral release.  She is referred to Malachi ParadiseJim Hsu MD, orthopedic [facility] and I will speak with Dr. Raynald KempHsu about the referral.

## 2017-04-09 NOTE — Progress Notes (Signed)
Dictation #1  [id number]  [id number]:[id number]

## 2017-04-24 ENCOUNTER — Telehealth (INDEPENDENT_AMBULATORY_CARE_PROVIDER_SITE_OTHER): Payer: Self-pay | Admitting: Orthopaedic Surgery

## 2017-04-24 ENCOUNTER — Encounter (INDEPENDENT_AMBULATORY_CARE_PROVIDER_SITE_OTHER): Admitting: Orthopaedic Surgery

## 2017-04-24 ENCOUNTER — Encounter (INDEPENDENT_AMBULATORY_CARE_PROVIDER_SITE_OTHER): Payer: Self-pay | Admitting: Orthopaedic Surgery

## 2017-04-24 ENCOUNTER — Ambulatory Visit (INDEPENDENT_AMBULATORY_CARE_PROVIDER_SITE_OTHER): Admitting: Orthopaedic Surgery

## 2017-04-24 ENCOUNTER — Encounter (INDEPENDENT_AMBULATORY_CARE_PROVIDER_SITE_OTHER): Payer: Self-pay | Admitting: Family Medicine

## 2017-04-24 VITALS — BP 125/76 | HR 74 | Temp 98.5°F | Ht 62.0 in | Wt 166.0 lb

## 2017-04-24 DIAGNOSIS — S83012A Lateral subluxation of left patella, initial encounter: Secondary | ICD-10-CM

## 2017-04-24 DIAGNOSIS — M2342 Loose body in knee, left knee: Secondary | ICD-10-CM

## 2017-04-24 DIAGNOSIS — Z6830 Body mass index (BMI) 30.0-30.9, adult: Secondary | ICD-10-CM

## 2017-04-24 NOTE — Progress Notes (Signed)
Referring doctor/PCP Dr. Michele Mcalpinelawson  The patient is here today for her left knee  This problem has been ongoing for about two months   Injury? Getting up off the floor  Symptoms Include: Clicking: Yes, Locking: No, Giving way: Yes, Other: catching, swelling and decreased ROM, affects gait  Pain location: medially  Pain Scale: 8/10  Treatment: topical cream, cortisone injection 2-3 weeks ago without relief  Imaging: Xray: Yes but not formatted to upload, Other: MRI put on [facility]  Surgical History: none

## 2017-04-24 NOTE — Telephone Encounter (Addendum)
Physician performing procedure:  Dr. Malachi ParadiseJim Hsu    Surgery Scheduling Logistics:   Procedure: LEFT Knee, AS Excision Loose Bodies  Date: 05-07-17  [facility]  Case status: Outpt  Diagnosis/ICD10: M23.42  Primary CPT: 29874  Additional CPT(s) including possible procedures N/A    Insurance:  Insurance Company: [employer]  Insurance Company Telephone Number: [phone]  Group Number:[id number]   Subscriber ID: [id number]  Insurance Rep: Olga MillersLogan G on 04-24-17 @ 3:35pm    Pre Authorization Needed:NO   PA Number:N/A  Authorization Status: N/A  Reference #:[id number]#:[id number]  Auth Received Date/Time:N/A  Auth Received Via:N/A  If auth received via website insert Screenshot here:   Fax, or Letter scanned to media:N/A/Date    Referral from PCP Needed: NO  Second Opinion Needed: NO     Judith [name] is [employer] insurance and therefore does not require PA.  See scanned document in pt's media account.

## 2017-04-24 NOTE — Progress Notes (Signed)
Chief Complaint  Left knee catching/locking.    History  The patient is 51 year old female here for a new consultation evaluation, referred by Dr. Elenora Gamma  The patient is here today for her left knee  This problem has been ongoing for about two months   Injury? Getting up off the floor, felt a pop, immediate pain, and since then persistent catching/locking during walking, often more than once a day, with sudden pain causing giving way episodes  Symptoms Include: Clicking, giving way, catching, swelling and decreased ROM, affects gait  Pain location: medially  Pain Scale: 8/10  Treatment: topical cream, cortisone injection 2-3 weeks ago without relief  Imaging: Xray: Yes but not formatted to upload, Other: MRI put on [facility]  Surgical History: none    Past Medical History  Past Medical History:   Diagnosis Date    Allergic rhinitis due to allergen     Anxiety state     Arthritis     Headache     Joint pain     Other organic sleep disorders        Past Surgical History  Past Surgical History:   Procedure Laterality Date    ANES; KNEE ARTHROSCOPY Right 2013, 2012, 1994    ANES; TOTAL KNEE REPLACEMENT Right 02/06/14    Dr. Michele Mcalpine    JOINT REPLACEMENT Right 2015    Dr Michele Mcalpine    LAPAROSCOP GASTRIC BYPASS  2004    MUA  knee Right 03/27/14    Dr. Michele Mcalpine    TONSILLECTOMY ONE-HALF <AGE 62  2001    TOTAL ABDOMINAL HYSTERECT W/WO RMVL TUBE OVARY      UNLISTED PROCEDURE HANDS/FINGERS         Medications  Outpatient Medications Prior to Visit   Medication Sig Dispense Refill    Acetaminophen (TYLENOL) 325 MG Oral Tab Take 325 mg by mouth daily as needed.      ALPRAZolam (XANAX) 0.25 MG Oral Tab Take 0.25 mg by mouth at bedtime.      Amoxicillin 500 MG Oral Tab Take 500 mg by mouth. QID      Enoxaparin Sodium (LOVENOX SC)       Hydrocodone-Acetaminophen 5-325 MG Oral Tab       Lidocaine 5 % External Patch Apply 1 patch onto the skin. Remove patch(s) after 12 hours.      Multiple Vitamin (MULTI VITAMIN  DAILY OR)       OxyCODONE HCl 5 MG Oral Tab 1/2-1 tab q 4 hours prn pain (Patient not taking: Reported on 04/09/2017) 60 tablet 0    Oxycodone-Acetaminophen 5-325 MG Oral Tab Take 1-2 tablets by mouth every 6 hours as needed for pain.      TraZODone HCl 50 MG Oral Tab Take 50 mg by mouth at bedtime as needed for sleep.      VITAMIN B1-B12 IM Once monthly       No facility-administered medications prior to visit.        Allergies  Review of patient's allergies indicates:  Allergies   Allergen Reactions    Aspirin      S/p gastric bypass. On her "do not take list"     Ibuprofen      S/p gastric bypass. 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      Social History Main Topics    Smoking status: Never Smoker    Smokeless tobacco: Never Used    Alcohol use Yes      Comment: rarely    Drug use: No    Sexual activity: Not on file     Other Topics Concern    Not on file     Social History Narrative    No narrative on file       Family History  Family History     Problem (# of Occurrences) Relation (Name,Age of Onset)    Arthritis (4) Mother, Father, Maternal Grandmother, Paternal Grandmother    Cancer (2) Maternal Grandmother, Brother    Diabetes (1) Sister    Heart Disease (1) Paternal Grandmother    No Known Problems (1) Paternal Grandfather    Pulmonary (1) Father    Rheumatoid Arthritis (1) Brother    Severe Sprains (1) Brother          Review of systems  Constitutional: Negative   Eyes: Negative   Ears, Nose, Mouth, Throat: Negative   Cardiovascular: Negative   Respiratory: Negative   Gastrointestinal: Negative   Genitourinary: Negative   Musculoskeletal: As noted in HPI above   Skin: Negative   Neurological: Negative   Psychiatric: Negative   Endocrine: Negative   Hematologic/Lymphatic: Negative   Allergic/Immunologic:  Negative     Physical Examination  The patient is 51 year old female in no acute distress and is alert and oriented.     BP 125/76    Pulse 74    Temp 98.5 F (36.9 C) (Oral)    Ht 5\' 2"  (1.575 m)    Wt 166 lb (75.3 kg)    SpO2 98%    BMI 30.36 kg/m     Standing and supine alignments are within normal limits and symmetric in both lower extremities. There is no evidence of motor or sensory functional disturbance in both upper extremities on observation and general testing. Gait is antalgic, favoring the left lower extremity.    Left knee exam shows range of motion from 5 degrees to 120 degrees of flexion limited by tension. There is moderate effusion. Stability exam generally without abnormal findings. Varus and valgus stressing induces no pain and demonstrates no laxity. There is medial parapatellar tenderness in the knee. McMurray exam is within normal limits. Patellofemoral crepitance is moderate. Patellar stability within normal limits medially and laterally, but with medial parapatellar tenderness.    Right knee exam shows generally preserved range of motion, post total knee arthroplasty, no effusion, no pain with movement, incision healed.     Skin exam shows no major abnormalities throughout both lower extremities. Distal neurovascular examination in both lower extremities is intact, with brisk capillary refills, normal warmth. No evidence of edema and lymphadenopathy in both upper and lower extremities. Calves soft and nontender bilaterally.    Head and neck areas show no abnormalities to inspection and general observation of appearance, contour and movement. Respirations unlabored and normal in rate and rhythm. Heart rate and rhythm are regular and within normal limits.     Imaging Studies  Her left knee MRI was reviewed with her. It shows a cartilaginous round loose body in the medial parapatellar region. Degenerative changes seen in all three compartments, and in the patellofemoral compartment there is  an area of focal  cartilage loss in the lateral trochlear region.    X-rays were also reviewed, showing no significant changes.    Impression  Symptomatic loose body, with locking and catching events, left knee.       Plan  We discussed in detail the history of the condition, symptoms, treatment history, current exam findings and imaging findings.     She has been experiencing locking events from basic function such as walking. She may have suffered a patellar subluxation, although today's patellar stability exam did not show apprehension, mainly pain.    We discussed the natural history of symptomatic loose body and the typical treatment options including conservative treatment versus surgical treatment and their pros and cons in detail. We also discussed the techniques of loose body removal and typical postoperative recovery course for general as well as sports activities. Postoperative physical therapy was also discussed.     I discussed with the patient the benefits, risks, and potential complications of left knee arthroscopic loose body removal and possible associated concurrent procedures such as chondroplasty, including but not limited to infection, excessive blood loss, neurovascular injury, anesthesia complications, and deep venous thrombosis leading to pulmonary embolism, postoperative stiffness requiring further manipulative or surgical reintervention.     After the above extended discussion, the patient wishes to proceed with the surgical option as outlined above. The surgery will be scheduled in the near future. All of the patients questions were answered.

## 2017-05-01 ENCOUNTER — Telehealth (INDEPENDENT_AMBULATORY_CARE_PROVIDER_SITE_OTHER): Payer: Self-pay | Admitting: Orthopaedic Surgery

## 2017-05-01 DIAGNOSIS — M2342 Loose body in knee, left knee: Secondary | ICD-10-CM

## 2017-05-01 MED ORDER — ACETAMINOPHEN 500 MG OR TABS
ORAL_TABLET | ORAL | 0 refills | Status: AC
Start: 2017-05-01 — End: ?

## 2017-05-01 MED ORDER — CEPHALEXIN 500 MG OR CAPS
500.0000 mg | ORAL_CAPSULE | Freq: Four times a day (QID) | ORAL | 0 refills | Status: AC
Start: 2017-05-01 — End: 2017-05-02

## 2017-05-01 MED ORDER — OXYCODONE HCL 5 MG OR TABS
ORAL_TABLET | ORAL | 0 refills | Status: AC
Start: 2017-05-01 — End: ?

## 2017-05-01 NOTE — Telephone Encounter (Signed)
Postop medication

## 2017-05-07 ENCOUNTER — Encounter (INDEPENDENT_AMBULATORY_CARE_PROVIDER_SITE_OTHER): Payer: Self-pay | Admitting: Medical-Surgical

## 2017-05-07 ENCOUNTER — Ambulatory Visit
Admission: RE | Admit: 2017-05-07 | Discharge: 2017-05-07 | Disposition: A | Discharge status: Home / Self Care | Attending: Orthopaedic Surgery | Admitting: Orthopaedic Surgery

## 2017-05-07 DIAGNOSIS — M1712 Unilateral primary osteoarthritis, left knee: Secondary | ICD-10-CM | POA: Insufficient documentation

## 2017-05-07 DIAGNOSIS — M2342 Loose body in knee, left knee: Secondary | ICD-10-CM | POA: Insufficient documentation

## 2017-05-07 DIAGNOSIS — Z96651 Presence of right artificial knee joint: Secondary | ICD-10-CM | POA: Insufficient documentation

## 2017-05-07 DIAGNOSIS — S83272A Complex tear of lateral meniscus, current injury, left knee, initial encounter: Secondary | ICD-10-CM

## 2017-05-07 HISTORY — PX: PR ARTHROSCOPY KNEE SYNOVECTOMY 2/>COMPARTMENTS: 29876

## 2017-05-08 ENCOUNTER — Telehealth (INDEPENDENT_AMBULATORY_CARE_PROVIDER_SITE_OTHER): Payer: Self-pay | Admitting: Orthopaedic Surgery

## 2017-05-08 NOTE — Telephone Encounter (Signed)
Routine call to see how patient is doing postop. The patient is doing well. No outstanding concerns. Reminded the patient about followup next week.

## 2017-05-15 ENCOUNTER — Ambulatory Visit (INDEPENDENT_AMBULATORY_CARE_PROVIDER_SITE_OTHER): Admitting: Orthopaedic Surgery

## 2017-05-15 VITALS — BP 119/72 | HR 65 | Ht 62.0 in | Wt 166.0 lb

## 2017-05-15 DIAGNOSIS — M2392 Unspecified internal derangement of left knee: Secondary | ICD-10-CM

## 2017-05-15 DIAGNOSIS — M1712 Unilateral primary osteoarthritis, left knee: Secondary | ICD-10-CM

## 2017-05-15 MED ORDER — ACETAMINOPHEN 325 MG OR TABS
325.0000 mg | ORAL_TABLET | Freq: Four times a day (QID) | ORAL | 0 refills | Status: AC | PRN
Start: 2017-05-15 — End: ?

## 2017-05-15 NOTE — Progress Notes (Signed)
Judith [name] returns for postoperative check after left knee extensive debridement and is doing well. She reports minimal to moderate pain, tolerable.    Objective  The patient is in no acute distress, and is alert and oriented.    The knee incisions are clean and dry.  Sutures are removed and Steri-Strips applied. Incision care instructions are reviewed. Knee range of motion is 0 to 120 degrees. Distal neurovascular exams in the operative lower extremity are intact. Calf is soft and nontender. The patient ambulates without use of crutches. Intraoperative photos demonstrating pre-and post-appearances of the patient's knee conditions are reviewed with the patient.    BP 119/72   Pulse 65   Ht 5\' 2"  (1.575 m)   Wt 166 lb (75.3 kg)   BMI 30.36 kg/m       Impression  Post left knee extensive debridement on 05/07/17. Overall doing well.    Plan  The patient will be undergoing physical therapy to facilitate recovery. The patient is to come back in about 6 weeks for the next recheck, but will return sooner or contact us sooner if any problems arise. All of patient's questions are answered.     Alternatively, if the patient is doing very well at 6 weeks and has no concerns about progress, return to activities or any other topics, the patient can follow-up with me on an as-needed basis. The patient is comfortable with the above plan for follow-up as needed.

## 2017-11-02 ENCOUNTER — Ambulatory Visit: Admit: 2017-11-02 | Discharge: 2017-11-02 | Payer: BLUE CROSS/BLUE SHIELD | Attending: Nurse Practitioner

## 2017-11-02 DIAGNOSIS — Z01419 Encounter for gynecological examination (general) (routine) without abnormal findings: Secondary | ICD-10-CM

## 2017-11-02 NOTE — Patient Instructions (Signed)
Patient Education        Well Visit, Women 53 to 65: Care Instructions  Your Care Instructions    Physical exams can help you stay healthy. Your doctor has checked your overall health and may have suggested ways to take good care of yourself. He or she also may have recommended tests. At home, you can help prevent illness with healthy eating, regular exercise, and other steps.  Follow-up care is a key part of your treatment and safety. Be sure to make and go to all appointments, and call your doctor if you are having problems. It's also a good idea to know your test results and keep a list of the medicines you take.  How can you care for yourself at home?   Reach and stay at a healthy weight. This will lower your risk for many problems, such as obesity, diabetes, heart disease, and high blood pressure.   Get at least 30 minutes of exercise on most days of the week. Walking is a good choice. You also may want to do other activities, such as running, swimming, cycling, or playing tennis or team sports.   Do not smoke. Smoking can make health problems worse. If you need help quitting, talk to your doctor about stop-smoking programs and medicines. These can increase your chances of quitting for good.   Protect your skin from too much sun. When you're outdoors from 10 a.m. to 4 p.m., stay in the shade or cover up with clothing and a hat with a wide brim. Wear sunglasses that block UV rays. Even when it's cloudy, put broad-spectrum sunscreen (SPF 30 or higher) on any exposed skin.   See a dentist one or two times a year for checkups and to have your teeth cleaned.   Wear a seat belt in the car.   Limit alcohol to 1 drink a day. Too much alcohol can cause health problems.  Follow your doctor's advice about when to have certain tests. These tests can spot problems early.   Cholesterol. Your doctor will tell you how often to have this done based on your age, family history, or other things that can increase your risk  for heart attack and stroke.   Blood pressure. Have your blood pressure checked during a routine doctor visit. Your doctor will tell you how often to check your blood pressure based on your age, your blood pressure results, and other factors.   Mammogram. Ask your doctor how often you should have a mammogram, which is an X-ray of your breasts. A mammogram can spot breast cancer before it can be felt and when it is easiest to treat.   Pap test and pelvic exam. Ask your doctor how often you should have a Pap test. You may not need to have a Pap test as often as you used to.   Vision. Have your eyes checked every year or two or as often as your doctor suggests. Some experts recommend that you have yearly exams for glaucoma and other age-related eye problems starting at age 53.   Hearing. Tell your doctor if you notice any change in your hearing. You can have tests to find out how well you hear.   Diabetes. Ask your doctor whether you should have tests for diabetes.   Colon cancer. You should begin tests for colon cancer at age 53. You may have one of several tests. Your doctor will tell you how often to have tests based on your age and risk.   Risks include whether you already had a precancerous polyp removed from your colon or whether your parents, sisters and brothers, or children have had colon cancer.   Thyroid disease. Talk to your doctor about whether to have your thyroid checked as part of a regular physical exam. Women have an increased chance of a thyroid problem.   Osteoporosis. You should begin tests for bone density at age 65. If you are younger than 65, ask your doctor whether you have factors that may increase your risk for this disease. You may want to have this test before age 65.   Heart attack and stroke risk. At least every 4 to 6 years, you should have your risk for heart attack and stroke assessed. Your doctor uses factors such as your age, blood pressure, cholesterol, and whether you smoke  or have diabetes to show what your risk for a heart attack or stroke is over the next 10 years.  When should you call for help?  Watch closely for changes in your health, and be sure to contact your doctor if you have any problems or symptoms that concern you.  Where can you learn more?  Go to [email or web address] and sign in to your MyChart account. Enter Y074 in the Search Health Information box to learn more about "Well Visit, Women 53 to 65: Care Instructions."     If you do not have an account, please click on the "Sign Up Now" link.  Current as of: March 05, 2017  Content Version: 11.8   2006-2018 [employer]. Care instructions adapted under license by [facility]. If you have questions about a medical condition or this instruction, always ask your healthcare professional. [employer], Incorporated disclaims any warranty or liability for your use of this information.

## 2017-11-02 NOTE — Progress Notes (Signed)
Pt presents today for pap smear and breast exam.  She denies any complaints today.      Last mammogram:  12/03/2016  Last pap smear:  09/2016  Contraception:  None  Gravida:  0  Para:  0  AB:  0  Last bone density:  "long time ago"   Last colonoscopy: Never

## 2017-11-02 NOTE — Progress Notes (Signed)
Meagan Long is a 51 y.o. female who presents today for her medical conditions/ complaints as noted below. Meagan Long is c/o of Gynecologic Exam (Pt presents today for pap smear and breast exam )        HPI   New pt presents for annual exam and pap smear. Needs order for screening mammogram. Requesting annual screening labs.     Last mammogram:  12/03/2016  Last pap smear:  09/2016  Contraception:  None  Gravida:  0  Para:  0  AB:  0  Last bone density:  "long time ago"   Last colonoscopy: Never  Patient's last menstrual period was 10/11/2017 (exact date).  [id number]    History reviewed. No pertinent past medical history.  Past Surgical History:   Procedure Laterality Date   . BARTHOLIN GLAND CYST EXCISION      X 3     Family History   Problem Relation Age of Onset   . Breast Cancer Maternal Grandmother         64's     Social History   Substance Use Topics   . Smoking status: Never Smoker   . Smokeless tobacco: Never Used   . Alcohol use Yes      Comment: Socially-rare       Current Outpatient Prescriptions   Medication Sig Dispense Refill   . vitamin B-12 (CYANOCOBALAMIN) 1000 MCG tablet Take 1,000 mcg by mouth daily     . vitamin E 400 UNIT capsule Take 400 Units by mouth daily     . vitamin C (ASCORBIC ACID) 500 MG tablet Take 500 mg by mouth daily     . Ginkgo Biloba 40 MG TABS Take by mouth       No current facility-administered medications for this visit.      No Known Allergies  Vitals:    11/02/17 1416   BP: 139/81   Pulse: 85     Body mass index is 25.81 kg/m.    Review of Systems   Constitutional: Negative.    HENT: Negative.    Eyes: Negative.    Respiratory: Negative.    Cardiovascular: Negative.    Gastrointestinal: Negative.  Negative for constipation and diarrhea.   Endocrine: Negative.    Genitourinary: Negative.  Negative for frequency, menstrual problem and urgency.   Musculoskeletal: Negative.    Skin: Negative.    Allergic/Immunologic: Negative.    Neurological: Negative.     Hematological: Negative.    Psychiatric/Behavioral: Negative.    All other systems reviewed and are negative.        Physical Exam   Constitutional: She is oriented to person, place, and time. She appears well-developed and well-nourished.   Neck: Normal range of motion. Neck supple. No thyroid mass and no thyromegaly present.   Cardiovascular: Normal rate and regular rhythm.    Pulmonary/Chest: Effort normal and breath sounds normal. Right breast exhibits no inverted nipple, no mass, no nipple discharge and no skin change. Left breast exhibits no inverted nipple, no mass, no nipple discharge and no skin change.   Abdominal: Soft. She exhibits no mass. There is no tenderness.   Genitourinary: Vagina normal and uterus normal. Cervix exhibits no motion tenderness. Right adnexum displays no mass and no tenderness. Left adnexum displays no mass and no tenderness.   Genitourinary Comments: Pap collected   Musculoskeletal: Normal range of motion.   Neurological: She is alert and oriented to person, place, and time.   Skin: Skin is warm  and dry.   Psychiatric: She has a normal mood and affect.   Nursing note and vitals reviewed.       Diagnosis Orders   1. Women's annual routine gynecological examination  PAP SMEAR    Human papillomavirus (HPV) DNA probe thin prep high risk   2. Screening for cervical cancer  PAP SMEAR    Human papillomavirus (HPV) DNA probe thin prep high risk   3. Screening for breast cancer  MAM Screening Bilateral   4. Screen for colon cancer  [facility], Kristie,  APRN       MEDICATIONS:  No orders of the defined types were placed in this encounter.      ORDERS:  Orders Placed This Encounter   Procedures   . MAM Screening Bilateral   . PAP SMEAR   . Human papillomavirus (HPV) DNA probe thin prep high risk   .  [facility] - [facility] Spanish[facility]ack, Kristie,  APRN       PLAN:  Pap collected  Ordered screening mammogram  Ordered colonoscopy  Ordered annual fasting labs  Requesting hormone  levels, still having regular periods, started periods when she was 51 years old    Patient Instructions   Patient Education        Well Visit, Women 50 to 6265: Care Instructions  Your Care Instructions    Physical exams can help you stay healthy. Your doctor has checked your overall health and may have suggested ways to take good care of yourself. He or she also may have recommended tests. At home, you can help prevent illness with healthy eating, regular exercise, and other steps.  Follow-up care is a key part of your treatment and safety. Be sure to make and go to all appointments, and call your doctor if you are having problems. It's also a good idea to know your test results and keep a list of the medicines you take.  How can you care for yourself at home?   Reach and stay at a healthy weight. This will lower your risk for many problems, such as obesity, diabetes, heart disease, and high blood pressure.   Get at least 30 minutes of exercise on most days of the week. Walking is a good choice. You also may want to do other activities, such as running, swimming, cycling, or playing tennis or team sports.   Do not smoke. Smoking can make health problems worse. If you need help quitting, talk to your doctor about stop-smoking programs and medicines. These can increase your chances of quitting for good.   Protect your skin from too much sun. When you're outdoors from 10 a.m. to 4 p.m., stay in the shade or cover up with clothing and a hat with a wide brim. Wear sunglasses that block UV rays. Even when it's cloudy, put broad-spectrum sunscreen (SPF 30 or higher) on any exposed skin.   See a dentist one or two times a year for checkups and to have your teeth cleaned.   Wear a seat belt in the car.   Limit alcohol to 1 drink a day. Too much alcohol can cause health problems.  Follow your doctor's advice about when to have certain tests. These tests can spot problems early.   Cholesterol. Your doctor will tell you  how often to have this done based on your age, family history, or other things that can increase your risk for heart attack and stroke.   Blood pressure. Have your blood pressure checked during  a routine doctor visit. Your doctor will tell you how often to check your blood pressure based on your age, your blood pressure results, and other factors.   Mammogram. Ask your doctor how often you should have a mammogram, which is an X-ray of your breasts. A mammogram can spot breast cancer before it can be felt and when it is easiest to treat.   Pap test and pelvic exam. Ask your doctor how often you should have a Pap test. You may not need to have a Pap test as often as you used to.   Vision. Have your eyes checked every year or two or as often as your doctor suggests. Some experts recommend that you have yearly exams for glaucoma and other age-related eye problems starting at age 53.   Hearing. Tell your doctor if you notice any change in your hearing. You can have tests to find out how well you hear.   Diabetes. Ask your doctor whether you should have tests for diabetes.   Colon cancer. You should begin tests for colon cancer at age 53. You may have one of several tests. Your doctor will tell you how often to have tests based on your age and risk. Risks include whether you already had a precancerous polyp removed from your colon or whether your parents, sisters and brothers, or children have had colon cancer.   Thyroid disease. Talk to your doctor about whether to have your thyroid checked as part of a regular physical exam. Women have an increased chance of a thyroid problem.   Osteoporosis. You should begin tests for bone density at age 53. If you are younger than 2165, ask your doctor whether you have factors that may increase your risk for this disease. You may want to have this test before age 53.   Heart attack and stroke risk. At least every 4 to 6 years, you should have your risk for heart attack and  stroke assessed. Your doctor uses factors such as your age, blood pressure, cholesterol, and whether you smoke or have diabetes to show what your risk for a heart attack or stroke is over the next 10 years.  When should you call for help?  Watch closely for changes in your health, and be sure to contact your doctor if you have any problems or symptoms that concern you.  Where can you learn more?  Go to [email or web address] and sign in to your MyChart account. Enter [phone] [id number] in the Search Health Information box to learn more about "Well Visit, Women 50 to 5965: Care Instructions."     If you do not have an account, please click on the "Sign Up Now" link.  Current as of: March 05, 2017  Content Version: 11.8   2006-2018 [employer]. Care instructions adapted under license by [facility]. If you have questions about a medical condition or this instruction, always ask your healthcare professional. [employer], [employer] disclaims any warranty or liability for your use of this information.

## 2017-11-05 LAB — HUMAN PAPILLOMAVIRUS (HPV) DNA PROBE THIN PREP HIGH RISK
HPV Genotype 16: NOT DETECTED
HPV Type 18: NOT DETECTED
Other High Risk HPV: NOT DETECTED

## 2017-11-12 ENCOUNTER — Encounter: Attending: Family Medicine

## 2017-11-12 NOTE — Progress Notes (Signed)
Tried to call pt, left vm. Needs Flagyl 500mg  po BID x7 days for BV on pap. Normal pap. Thanks ML

## 2017-12-04 ENCOUNTER — Encounter

## 2017-12-04 ENCOUNTER — Inpatient Hospital Stay: Admit: 2017-12-04 | Payer: BLUE CROSS/BLUE SHIELD

## 2017-12-04 DIAGNOSIS — Z1231 Encounter for screening mammogram for malignant neoplasm of breast: Secondary | ICD-10-CM

## 2017-12-04 LAB — COMPREHENSIVE METABOLIC PANEL
ALT: 9 U/L (ref 5–33)
AST: 15 U/L (ref 5–32)
Albumin: 4 g/dL (ref 3.5–5.2)
Alkaline Phosphatase: 72 U/L (ref 35–104)
Anion Gap: 15 mmol/L (ref 7–19)
BUN: 13 mg/dL (ref 6–20)
CO2: 23 mmol/L (ref 22–29)
Calcium: 9.3 mg/dL (ref 8.6–10.0)
Chloride: 104 mmol/L (ref 98–111)
Creatinine: 0.8 mg/dL (ref 0.5–0.9)
GFR Non-African American: >60 (ref >60)
Glucose: 102 mg/dL (ref 74–109)
Potassium: 4.3 mmol/L (ref 3.5–5.0)
Sodium: 142 mmol/L (ref 136–145)
Total Bilirubin: 0.3 mg/dL (ref 0.2–1.2)
Total Protein: 7.4 g/dL (ref 6.6–8.7)

## 2017-12-04 LAB — CBC
Hematocrit: 40.7 % (ref 37.0–47.0)
Hemoglobin: 13 g/dL (ref 12.0–16.0)
MCH: 27.5 pg (ref 27.0–31.0)
MCHC: 31.9 g/dL — ABNORMAL LOW (ref 33.0–37.0)
MCV: 86 fL (ref 81.0–99.0)
MPV: 9.7 fL (ref 9.4–12.3)
Platelets: 195 10*3/uL (ref 130–400)
RBC: 4.73 M/uL (ref 4.20–5.40)
RDW: 13.2 % (ref 11.5–14.5)
WBC: 7.7 10*3/uL (ref 4.8–10.8)

## 2017-12-04 LAB — LIPID PANEL
Cholesterol, Total: 181 mg/dL (ref 160–199)
HDL: 74 mg/dL (ref 65–121)
LDL Calculated: 93 mg/dL (ref <100)
Triglycerides: 71 mg/dL (ref 0–149)

## 2017-12-04 LAB — FOLLICLE STIMULATING HORMONE: FSH: 26.6 m[IU]/mL

## 2017-12-04 LAB — TSH: TSH: 1.87 u[IU]/mL (ref 0.270–4.200)

## 2017-12-24 NOTE — Telephone Encounter (Signed)
Dr Monna Fam [phone] in [place] is booking new patient for colonoscopy in April. Labs and Mammogram normal. Spoke to her mom first and she said to call Daughter and leave a message with daughter.

## 2017-12-28 NOTE — Telephone Encounter (Signed)
I tried calling this NP to discuss open access colon screen, she did not answer her phone. I left a VM asking for a return call. Dh cma

## 2017-12-28 NOTE — Telephone Encounter (Signed)
Pt called back today and asked about labs and colonoscopy stating no one called her back about them. Advised Laurette Schimke is scheduling out and Dr Dorothyann Gibbs in [place] is sch into April for new Pt . Advised would send message to [facility].

## 2017-12-30 NOTE — Telephone Encounter (Signed)
12-30-17 3:44 PM  This patient called back to discuss OA colon screen, I did inform her the first available opening with Dr.Jones is July or August 2019, [place] and once she finds out when she will be returning she will call me back to discuss open access colon screen. Dh cma

## 2017-12-30 NOTE — Telephone Encounter (Signed)
12-30-17 9:01 am    Second VM left for this patient to discuss OA, we are playing phone tag, I asked her to call the main number and ask for me, that way hopefully I can speak with her then and get her scheduled for her Colonoscopy through OA approval. Dh cma

## 2017-12-30 NOTE — Telephone Encounter (Signed)
12-30-17 2:23 PM    I thought I would try to reach this patient again but still NA, I did leave her another VM to return my call. Dh cma

## 2018-01-01 ENCOUNTER — Encounter

## 2018-01-01 NOTE — Telephone Encounter (Signed)
Called and referred pt to Dr. Horald PollenFinch's office. Her appt is 03/18/18 @ 2:00pm-she needs to arrive by 1:30pm to fill out paperwork. Tried to call pt back to let her know-was unable to reach her so I left VM asking her to call back. [facility](Cancelled [place] referral and put in new referral to Dr. Dorothyann GibbsFinch)

## 2018-01-01 NOTE — Telephone Encounter (Signed)
Lattimer Plummer booking pt for July and she does not want to wait that long. Please ref and book with [facility]na Fam in [place] as they stated they were booking in April on last T. Encounter. Thanks

## 2018-01-04 NOTE — Telephone Encounter (Signed)
Called and spoke to pt re: referral-she VU and will let us know if she needs anything else!

## 2018-03-14 ENCOUNTER — Inpatient Hospital Stay: Payer: Self-pay

## 2018-03-16 ENCOUNTER — Inpatient Hospital Stay: Payer: Self-pay

## 2018-09-03 ENCOUNTER — Inpatient Hospital Stay: Admit: 2018-09-03

## 2018-09-03 ENCOUNTER — Encounter

## 2018-09-03 DIAGNOSIS — Z1231 Encounter for screening mammogram for malignant neoplasm of breast: Secondary | ICD-10-CM

## 2018-11-05 ENCOUNTER — Encounter: Attending: Nurse Practitioner

## 2019-06-17 ENCOUNTER — Inpatient Hospital Stay: Payer: Self-pay

## 2019-09-28 ENCOUNTER — Inpatient Hospital Stay: Payer: Self-pay

## 2019-10-11 LAB — HEMOGLOBIN A1C: Hemoglobin A1C, External: 5.7 % — ABNORMAL HIGH (ref 4.80–5.60)

## 2019-10-19 ENCOUNTER — Inpatient Hospital Stay: Admit: 2019-10-19 | Payer: BLUE CROSS/BLUE SHIELD | Primary: Nurse Practitioner

## 2019-10-19 ENCOUNTER — Encounter

## 2019-10-19 DIAGNOSIS — Z1231 Encounter for screening mammogram for malignant neoplasm of breast: Secondary | ICD-10-CM

## 2020-04-23 ENCOUNTER — Inpatient Hospital Stay: Payer: Self-pay

## 2020-07-17 ENCOUNTER — Encounter

## 2020-07-17 ENCOUNTER — Inpatient Hospital Stay: Admit: 2020-07-17 | Payer: PRIVATE HEALTH INSURANCE | Primary: Nurse Practitioner

## 2020-07-17 DIAGNOSIS — Z1231 Encounter for screening mammogram for malignant neoplasm of breast: Secondary | ICD-10-CM

## 2020-08-06 ENCOUNTER — Encounter: Payer: PRIVATE HEALTH INSURANCE | Attending: Obstetrics & Gynecology | Primary: Nurse Practitioner

## 2020-08-06 NOTE — Progress Notes (Deleted)
Pt presents today for pap smear and breast exam.  She also complains of     Last mammogram:    Last pap smear:    Contraception:     Last bone density:    Last colonoscopy:

## 2020-10-18 ENCOUNTER — Encounter: Payer: PRIVATE HEALTH INSURANCE | Attending: Obstetrics & Gynecology | Primary: Nurse Practitioner

## 2021-07-18 ENCOUNTER — Inpatient Hospital Stay: Admit: 2021-07-18 | Discharge: 2021-08-15 | Payer: PRIVATE HEALTH INSURANCE

## 2021-07-18 ENCOUNTER — Encounter

## 2021-07-18 DIAGNOSIS — Z1231 Encounter for screening mammogram for malignant neoplasm of breast: Secondary | ICD-10-CM

## 2021-08-26 NOTE — Telephone Encounter (Signed)
Patient called to speak with the nurse regarding an urgent issues that is going on. Please return call.

## 2021-08-26 NOTE — Telephone Encounter (Signed)
Attempted to return call, LM on 1st number, called the other number and someone else answered saying she was working and cannot come to the phone.  I asked if she was ok since she said she had an urgent issue.  She said yes, that she just needed to get back to work.  I told her she can call the office tomorrow after 8:30am.

## 2021-08-27 NOTE — Telephone Encounter (Signed)
Meagan Long requests that office return their call. The best time to reach her is Anytime. Patient requesting to speak with [place] she states she spoke with her recently and was needing records to possibly move appointment to sooner date. She states she will not be at her phone all times during the day but if she could leave a message with a designated time so she could call back.     Thank you.

## 2021-10-25 ENCOUNTER — Ambulatory Visit: Admit: 2021-10-25 | Discharge: 2021-10-25 | Payer: PRIVATE HEALTH INSURANCE | Attending: Family

## 2021-10-25 DIAGNOSIS — Z7689 Persons encountering health services in other specified circumstances: Secondary | ICD-10-CM

## 2021-10-25 NOTE — Progress Notes (Signed)
Meagan Long is a 55 y.o. female who presents today for her medical conditions/ complaints as noted below. Meagan Long is c/o of New Patient        HPI  Patient presents today as a new patient to discuss abnl pap smear. 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 This year negative pap HPV other +, but I had to track down information from [facility] as pt is from [facility] office and the genotyping for HPV was not included in records pt purchased for $1 a page and otherwise there was no ability to tell hpv if + or -.    Was told repeatedly over last 2 years needed colpo but felt off about results.     Patient's last menstrual period was 10/11/2017 (exact date).  [id number]    History reviewed. No pertinent past medical history.  Past Surgical History:   Procedure Laterality Date    BARTHOLIN GLAND CYST EXCISION      X 3    BREAST BIOPSY Right 2008    b9    BREAST BIOPSY Left 2008    b9     Family History   Problem Relation Age of Onset    Breast Cancer Maternal Grandmother 47        80's     Social History     Tobacco Use    Smoking status: Never    Smokeless tobacco: Never   Substance Use Topics    Alcohol use: Yes     Comment: Socially-rare       No current outpatient medications on file.     No current facility-administered medications for this visit.     No Known Allergies  Vitals:    10/25/21 1119   BP: 132/88   Pulse: 71     Body mass index is 27.55 kg/m.    Review of Systems   Constitutional: Negative.    HENT: Negative.     Eyes: Negative.    Respiratory: Negative.     Cardiovascular: Negative.    Gastrointestinal: Negative.    Endocrine: Negative.    Genitourinary: Negative.  Negative for difficulty urinating, dyspareunia, dysuria, enuresis, frequency, hematuria, menstrual problem, pelvic pain, urgency and vaginal discharge.   Musculoskeletal: Negative.    Skin: Negative.    Allergic/Immunologic: Negative.    Neurological: Negative.    Hematological: Negative.     Psychiatric/Behavioral: Negative.         Physical Exam  Vitals and nursing note reviewed.   Constitutional:       General: She is not in acute distress.     Appearance: She is well-developed. She is not diaphoretic.   HENT:      Head: Normocephalic and atraumatic.   Eyes:      Conjunctiva/sclera: Conjunctivae normal.      Pupils: Pupils are equal, round, and reactive to light.   Pulmonary:      Effort: Pulmonary effort is normal.   Abdominal:      Tenderness: There is no guarding.   Musculoskeletal:         General: Normal range of motion.      Cervical back: Normal range of motion.      Comments: Normal ROM in all 4 extremities; normal gait   Skin:     General: Skin is warm and dry.   Neurological:      Mental Status: She is alert and oriented to person, place, and  time.      Motor: No abnormal muscle tone.      Coordination: Coordination normal.   Psychiatric:         Behavior: Behavior normal.        Diagnosis Orders   1. Encounter to establish care        2. Encounter to discuss test results        3. Atypical squamous cells of undetermined significance (ASCUS) on Papanicolaou smear of cervix        4. Human papilloma virus (HPV) counseling            MEDICATIONS:  No orders of the defined types were placed in this encounter.      ORDERS:  No orders of the defined types were placed in this encounter.      PLAN:  ASCCP guidelines reviewed w pt. Hpv discussed  I called and got results from this year. No further testing recommended. To repeat pap and hpv in 1 year.   All her questions answered.   Over 50% of the total visit time of 40 minutes was spent on counseling and/or coordination of care, record retrieval, guideline discussion. All questions were answered and the patient voiced understanding.    There are no Patient Instructions on file for this visit.

## 2021-11-11 NOTE — Telephone Encounter (Signed)
From: Meagan Long  To: Meagan Long  Sent: 10/25/2021 12:48 PM CST  Subject: pap/hpv    Hi! I got your test results and will be putting into the chart. 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 I put that into the ASCCP guidelines and it still recommends nothing else and just repeat pap and hpv next year.   2021 pap was ascus and hpv negative across the board thanks!!!

## 2021-11-14 ENCOUNTER — Encounter: Payer: PRIVATE HEALTH INSURANCE | Attending: Obstetrics & Gynecology

## 2022-02-21 NOTE — Telephone Encounter (Signed)
Patient called in to provide more information and stated she wanted to discuss a procedure.

## 2022-02-21 NOTE — Telephone Encounter (Signed)
Patient called to discuss lab work from the past. Patient states she had a discussion with Sherri regarding this but that it was not at an appointment. Reached out and left patient a voicemail to get an in office appointment or virtual visit set up but patient also is requesting to speak to [facility]     Saddie Sandeen, LPN

## 2022-02-24 NOTE — Telephone Encounter (Signed)
Spoke to patient. She would like repeat pap, she is going to speak to insurance to determine coverage and will reach back out.     [name], LPN

## 2022-02-25 ENCOUNTER — Encounter: Payer: PRIVATE HEALTH INSURANCE | Attending: Family

## 2022-04-07 ENCOUNTER — Encounter

## 2022-05-01 IMAGING — MG Standard Screening - ComboHD
11 of 15 series · 11 of 15 positions shown · non-contrast
Comparison: April 04, 2021 and December 26, 2019
Technologist: Nor Atikah Prabakaran, RT

BIRAD: [id number]
INDICATION: Breast cancer screening
TECHNIQUE: Bilateral digital screening mammography with tomosynthesis. Images reviewed with CAD.

[R CC synth-2D]
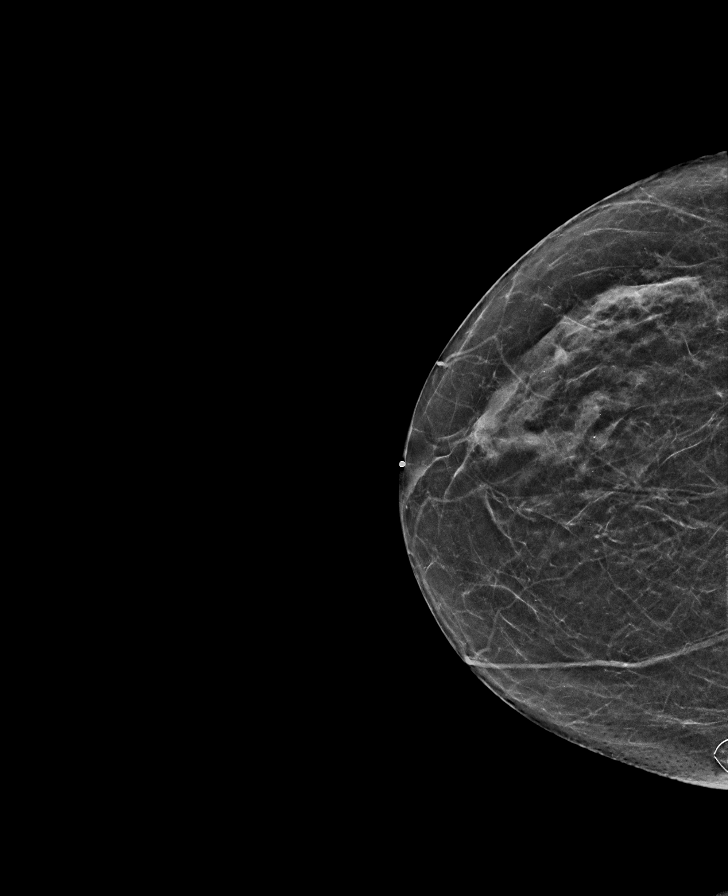

[L CC synth-2D]
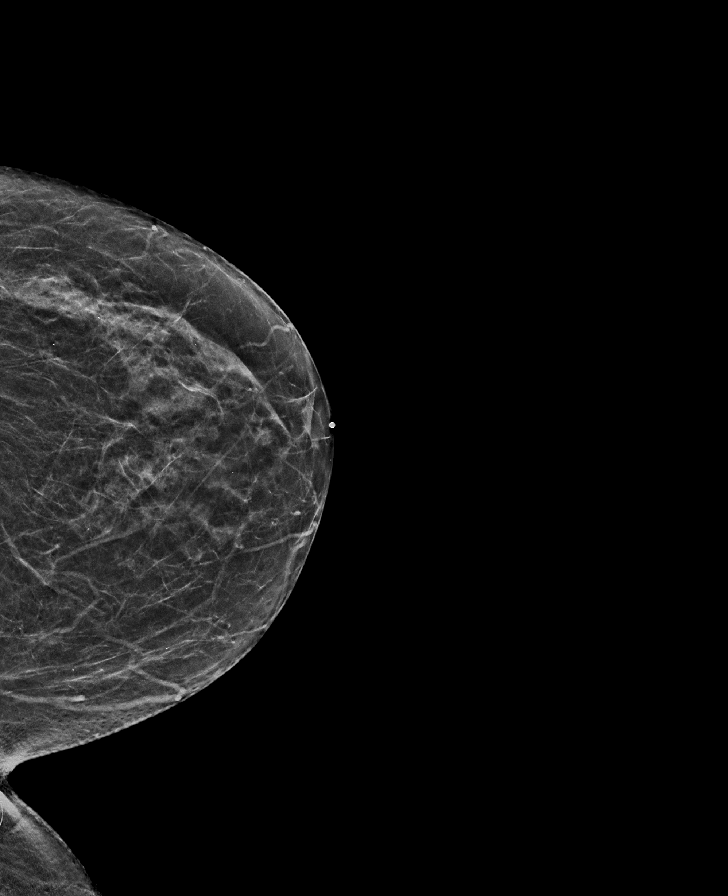

[R MLO synth-2D]
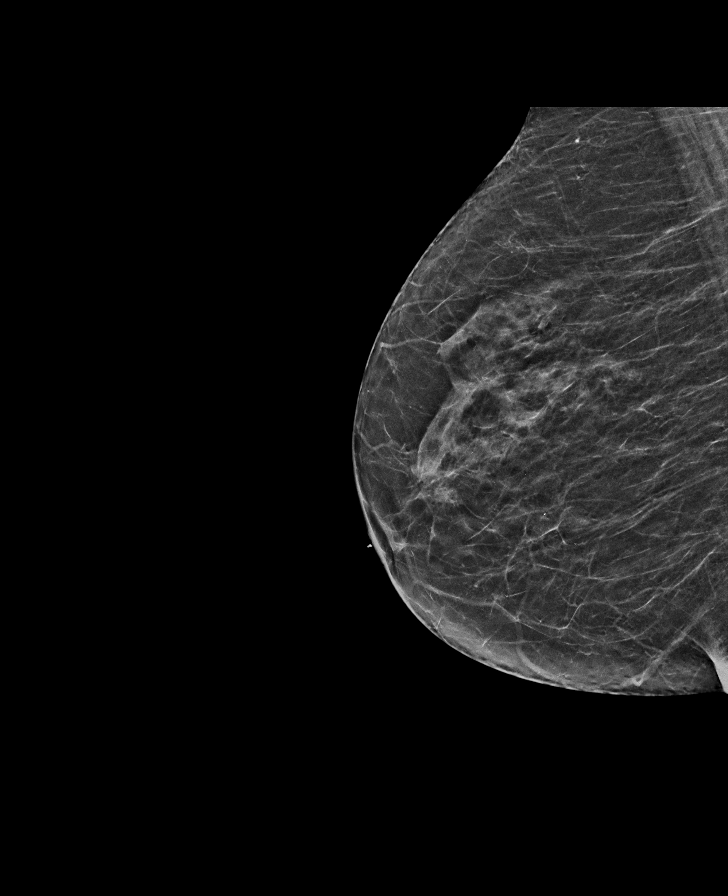

[R CC]
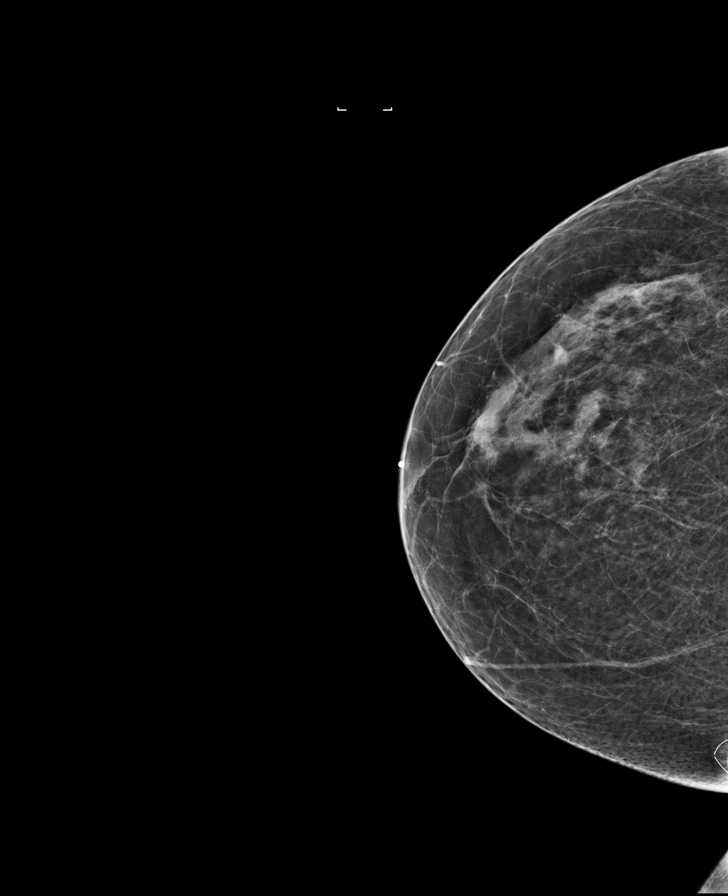

[L MLO synth-2D]
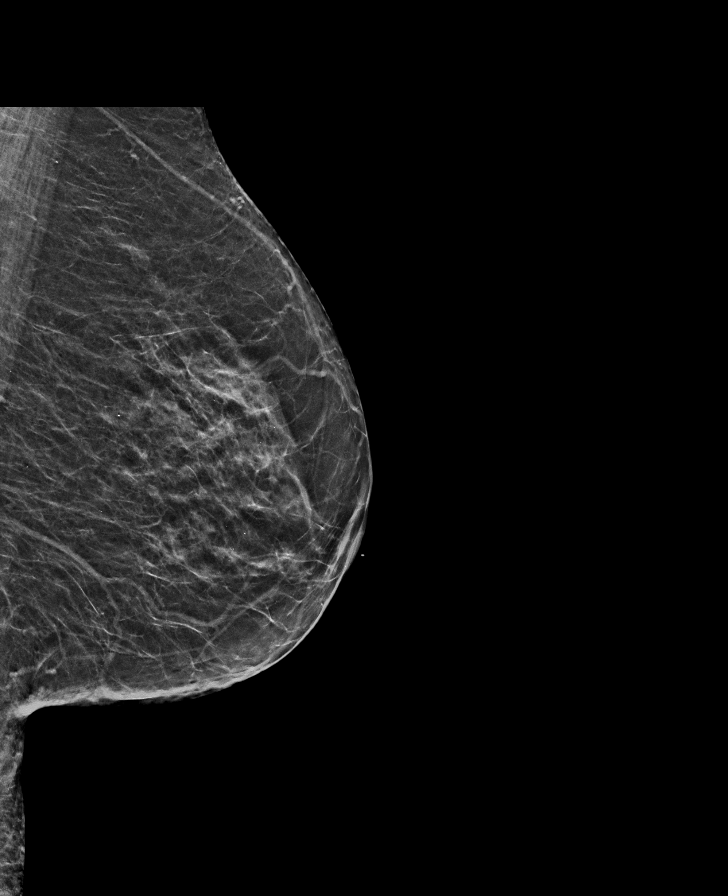

[R MLO]
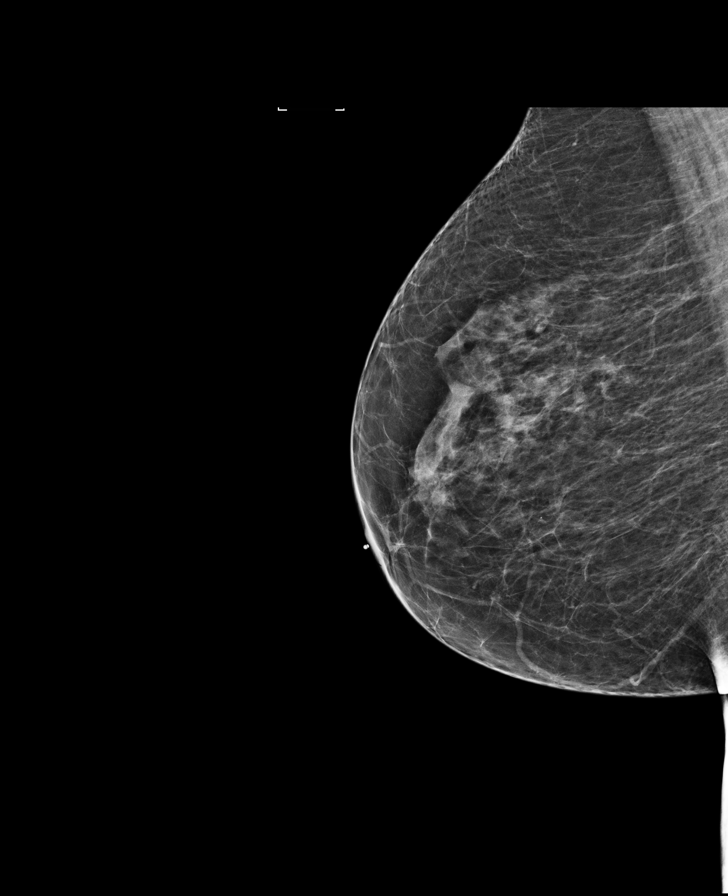

[L MLO]
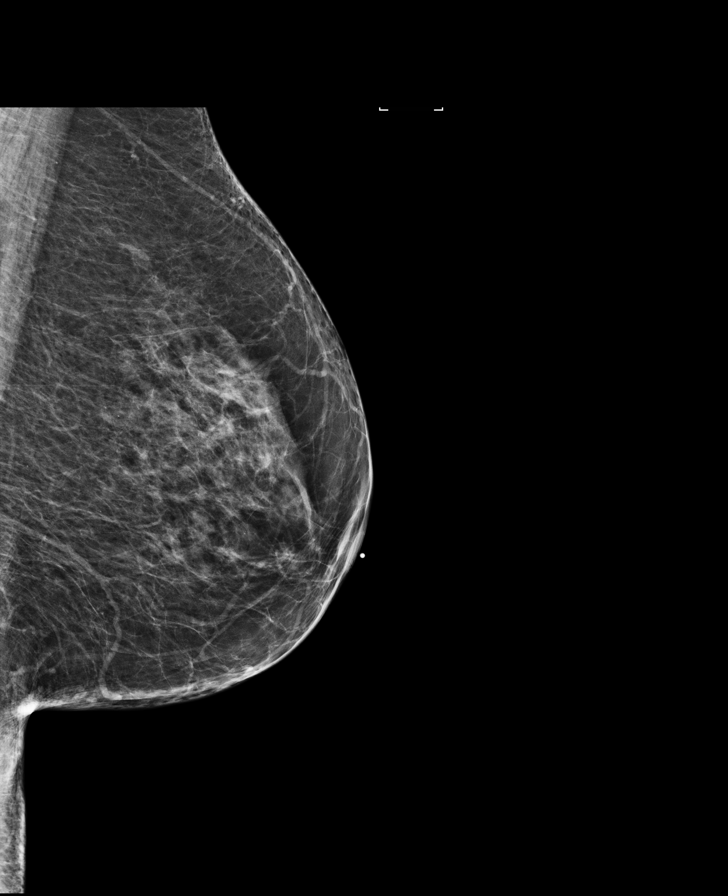

[L CC]
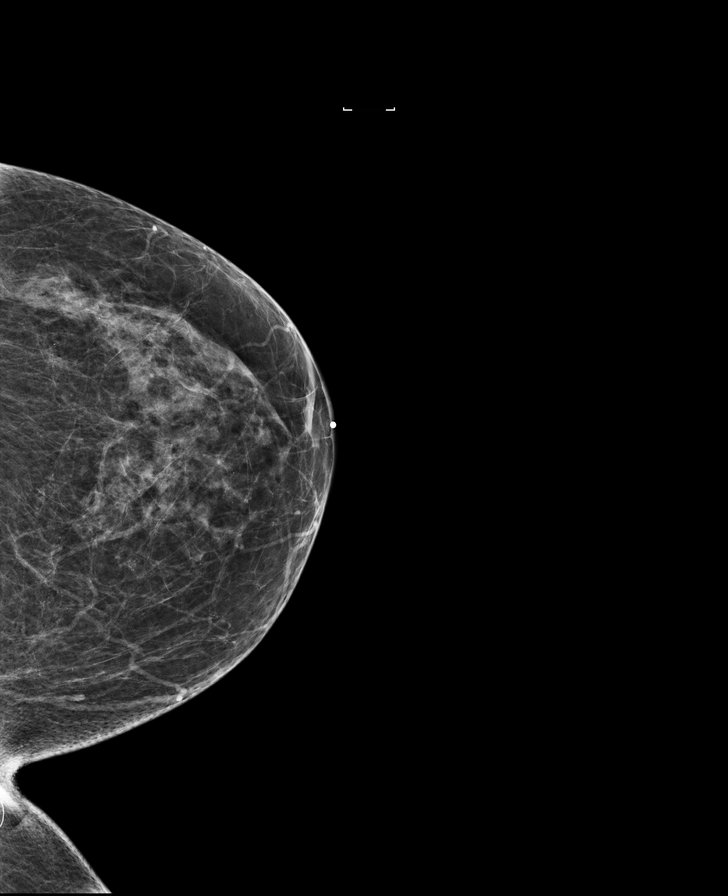

[L MLO tomo]
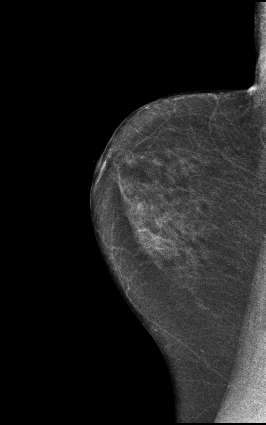

[R MLO tomo]
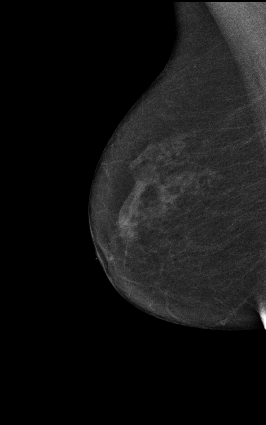

[R CC tomo]
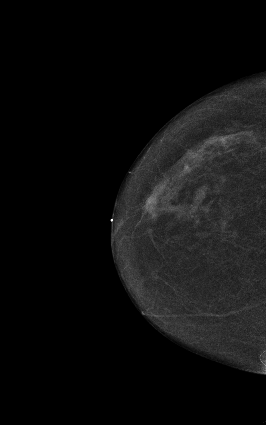

[11 of 15 positions shown; findings below may reference images not displayed]

FINDINGS: The breasts are heterogeneously dense, which may obscure small masses.  
No suspicious mass, architectural distortion, or microcalcification is seen.
No additional suspicious mammographic findings within either breast.
IMPRESSION: 1. No mammographic evidence of malignancy, routine annual screening mammography recommended.
Final Classification:  BI-RADS 1: Negative

Note:  
1.  A negative mammogram report should not delay biopsy if a clinically suspicious mass is present.  Some malignancies are not identified with mammography.
2.  Heterogenous and extremely dense  breast patterns can obscure malignancy and pursuit of any palpable change is recommended on the basis of the clinical impression even in the presence of a normal mammogram. Screening breast ultrasound and/or MRI may be beneficial depending on the patient's clinical risk factors of breast cancer.

## 2022-07-25 ENCOUNTER — Telehealth

## 2022-07-25 ENCOUNTER — Inpatient Hospital Stay: Admit: 2022-07-25 | Payer: PRIVATE HEALTH INSURANCE

## 2022-07-25 ENCOUNTER — Ambulatory Visit: Admit: 2022-07-25 | Discharge: 2022-07-25 | Payer: PRIVATE HEALTH INSURANCE | Attending: Family

## 2022-07-25 DIAGNOSIS — Z1231 Encounter for screening mammogram for malignant neoplasm of breast: Secondary | ICD-10-CM

## 2022-07-25 DIAGNOSIS — Z01419 Encounter for gynecological examination (general) (routine) without abnormal findings: Secondary | ICD-10-CM

## 2022-07-25 NOTE — Progress Notes (Signed)
Meagan Long is a 56 y.o. female who presents today for her medical conditions/ complaints as noted below. Meagan Long is c/o of Annual Exam        HPI  Pt presents today for pap smear and breast exam.  Pt was very upset with MA not being aware of her med records in media and was upset when I entered room as well regarding her not being aware of past medical history. I explained this was someone filling in due to my normal MA being ill and that I remembered our entire visit last time she was in and no worries. Still upset that was in room 23 minutes but I explained to her she arrived for her 0930 appt early and it was 940 when I entered room so that isn't unusual. Eventually I offered for manager to come speak with pt, she agreed. I had Meagan Long to come to room after I discussed pt issues she had told me with visit. I left room ultimately to see other pt's on my schedule while they sorted her concerns out, and manager informed me pt ready for my visit w her after that    Last mammogram:  07/25/22  Last pap smear:  2022  Contraception:  no  Gravida:  0  Para:  0  AB:  0  Last bone density:  unknown   Last colonoscopy: 2019  Patient's last menstrual period was 10/11/2017 (exact date).  G0P0000    History reviewed. No pertinent past medical history.  Past Surgical History:   Procedure Laterality Date    BARTHOLIN GLAND CYST EXCISION      X 3    BREAST BIOPSY Right 2008    b9    BREAST BIOPSY Left 2008    b9    COLONOSCOPY      2019     Family History   Problem Relation Age of Onset    Breast Cancer Maternal Grandmother 31        80's     Social History     Tobacco Use    Smoking status: Never    Smokeless tobacco: Never   Substance Use Topics    Alcohol use: Yes     Comment: Socially-rare       No current outpatient medications on file.     No current facility-administered medications for this visit.     No Known Allergies  There were no vitals filed for this visit.  Body mass index is 27.2  kg/m.    Review of Systems   Constitutional: Negative.    HENT: Negative.     Eyes: Negative.    Respiratory: Negative.     Cardiovascular: Negative.    Gastrointestinal: Negative.    Endocrine: Negative.    Genitourinary: Negative.  Negative for difficulty urinating, dyspareunia, dysuria, enuresis, frequency, hematuria, menstrual problem, pelvic pain, urgency and vaginal discharge.   Musculoskeletal: Negative.    Skin: Negative.    Allergic/Immunologic: Negative.    Neurological: Negative.    Hematological: Negative.    Psychiatric/Behavioral: Negative.         Physical Exam  Vitals and nursing note reviewed.   Constitutional:       General: She is not in acute distress.     Appearance: She is well-developed. She is not diaphoretic.   HENT:      Head: Normocephalic and atraumatic.      Right Ear: External ear normal.      Left Ear: External  ear normal.      Nose: Nose normal.      Mouth/Throat:      Mouth: Mucous membranes are moist.      Pharynx: Oropharynx is clear.   Eyes:      General: Lids are normal.         Right eye: No discharge.         Left eye: No discharge.      Conjunctiva/sclera: Conjunctivae normal.      Pupils: Pupils are equal, round, and reactive to light.   Neck:      Thyroid: No thyroid mass or thyromegaly.      Trachea: No tracheal deviation.   Cardiovascular:      Rate and Rhythm: Normal rate and regular rhythm.      Heart sounds: Normal heart sounds. No murmur heard.  Pulmonary:      Effort: Pulmonary effort is normal.      Breath sounds: Normal breath sounds. No wheezing.   Chest:   Breasts:     Breasts are symmetrical.      Right: No inverted nipple, mass, nipple discharge, skin change or tenderness.      Left: No inverted nipple, mass, nipple discharge, skin change or tenderness.   Abdominal:      General: Bowel sounds are normal. There is no distension.      Palpations: Abdomen is soft. There is no mass.      Tenderness: There is no abdominal tenderness.      Hernia: There is no hernia  in the left inguinal area or right inguinal area.   Genitourinary:     General: Normal vulva.      Labia:         Right: No rash, tenderness, lesion or injury.         Left: No rash, tenderness, lesion or injury.       Urethra: No prolapse, urethral pain, urethral swelling or urethral lesion.      Vagina: Normal. No vaginal discharge or tenderness.      Cervix: Normal. Discharge: normal cervical mucosa.      Uterus: Normal. Not enlarged and not tender.       Adnexa: Right adnexa normal and left adnexa normal.        Right: No mass, tenderness or fullness.          Left: No mass, tenderness or fullness.        Rectum: Normal. No mass, anal fissure or external hemorrhoid.      Comments: Pap collected for cervical cytology-some cervical stenosis noted  Musculoskeletal:         General: No tenderness. Normal range of motion.      Cervical back: Normal range of motion and neck supple.   Lymphadenopathy:      Cervical:      Right cervical: No superficial, deep or posterior cervical adenopathy.     Left cervical: No superficial, deep or posterior cervical adenopathy.      Upper Body:      Right upper body: No supraclavicular, pectoral or epitrochlear adenopathy.      Left upper body: No supraclavicular, pectoral or epitrochlear adenopathy.      Lower Body: No right inguinal adenopathy. No left inguinal adenopathy.   Skin:     General: Skin is warm and dry.      Capillary Refill: Capillary refill takes 2 to 3 seconds.      Findings: No rash.   Neurological:  Mental Status: She is alert and oriented to person, place, and time. She is not disoriented.      Sensory: No sensory deficit.      Coordination: Coordination normal.      Gait: Gait normal.      Deep Tendon Reflexes: Reflexes are normal and symmetric.   Psychiatric:         Speech: Speech normal.         Behavior: Behavior normal.         Thought Content: Thought content normal.         Judgment: Judgment normal.         Diagnosis Orders   1. Well woman exam with  routine gynecological exam  PAP SMEAR    Human papillomavirus (HPV) DNA probe thin prep high risk      2. Screening for cervical cancer  PAP SMEAR      3. Screening for human papillomavirus (HPV)  Human papillomavirus (HPV) DNA probe thin prep high risk          MEDICATIONS:  No orders of the defined types were placed in this encounter.      ORDERS:  Orders Placed This Encounter   Procedures    PAP SMEAR    Human papillomavirus (HPV) DNA probe thin prep high risk       PLAN:  Pap collected  Rtc in 1 year or prn problems  There are no Patient Instructions on file for this visit.

## 2022-07-25 NOTE — Telephone Encounter (Signed)
Pt. Is requesting order for mammogram added to epic for next year. Pt. Is scheduled next year with Jasmine December for her physical on 8/23. Pt would like to coordinate again with mammogram. She would liked scheduled again at the 7:50 time. Please advise pt.

## 2022-07-25 NOTE — Progress Notes (Signed)
Pt presents today for pap smear and breast exam.  She also complains of     Last mammogram:  07/25/2022  Last pap smear:  2022  Contraception:  postmeno  Gravida:    Para:    AB:    Last bone density:    Last colonoscopy:

## 2022-07-31 LAB — HUMAN PAPILLOMAVIRUS (HPV) DNA PROBE THIN PREP HIGH RISK
HPV Genotype 16: NOT DETECTED
HPV Type 18: NOT DETECTED
HPVOH (Other Types): DETECTED — AB

## 2022-08-07 NOTE — Telephone Encounter (Signed)
Left message for patient to call in to discuss pap results.

## 2022-08-08 NOTE — Telephone Encounter (Signed)
Patient lvm for results. I tried to return her call, no answer but did leave voicemail for her to call back

## 2022-08-19 NOTE — Telephone Encounter (Signed)
Patient called in regarding upcoming procedure. Was under the impression that Lavonna Rua would call her with her results instead of a nurse and would like to speak to someone more knowledgeable such as Sheri. Patient also requested results be mailed to her so she can be well informed. Results placed in mail bin to be sent out.     Meagan Blanchet, LPN

## 2022-09-03 ENCOUNTER — Encounter: Payer: PRIVATE HEALTH INSURANCE | Attending: Family

## 2023-03-31 ENCOUNTER — Encounter

## 2023-04-28 ENCOUNTER — Inpatient Hospital Stay: Admit: 2023-04-28 | Payer: BLUE CROSS/BLUE SHIELD

## 2023-04-28 ENCOUNTER — Ambulatory Visit: Payer: PRIVATE HEALTH INSURANCE

## 2023-04-28 ENCOUNTER — Inpatient Hospital Stay: Admit: 2023-04-28 | Payer: PRIVATE HEALTH INSURANCE

## 2023-04-28 DIAGNOSIS — Z13820 Encounter for screening for osteoporosis: Secondary | ICD-10-CM

## 2023-04-28 DIAGNOSIS — Z1231 Encounter for screening mammogram for malignant neoplasm of breast: Secondary | ICD-10-CM

## 2023-05-13 ENCOUNTER — Inpatient Hospital Stay: Payer: PRIVATE HEALTH INSURANCE | Primary: Nurse Practitioner

## 2023-05-13 ENCOUNTER — Encounter

## 2023-05-13 ENCOUNTER — Inpatient Hospital Stay: Admit: 2023-05-13 | Payer: PRIVATE HEALTH INSURANCE | Primary: Nurse Practitioner

## 2023-05-13 DIAGNOSIS — R922 Inconclusive mammogram: Secondary | ICD-10-CM

## 2023-05-13 DIAGNOSIS — R928 Other abnormal and inconclusive findings on diagnostic imaging of breast: Secondary | ICD-10-CM

## 2023-07-31 ENCOUNTER — Encounter: Payer: BLUE CROSS/BLUE SHIELD | Attending: Family | Primary: Nurse Practitioner

## 2023-07-31 ENCOUNTER — Ambulatory Visit: Payer: PRIVATE HEALTH INSURANCE | Primary: Nurse Practitioner

## 2024-05-19 ENCOUNTER — Inpatient Hospital Stay: Admit: 2024-05-19 | Payer: BLUE CROSS/BLUE SHIELD

## 2024-05-19 ENCOUNTER — Encounter

## 2024-05-19 VITALS — Ht 63.0 in | Wt 155.0 lb

## 2024-05-19 DIAGNOSIS — Z1231 Encounter for screening mammogram for malignant neoplasm of breast: Secondary | ICD-10-CM

## 9554-02-05 DEATH — deceased
